# Patient Record
Sex: Female | Born: 1950
Health system: Southern US, Community
[De-identification: ages and names within clinical notes are randomized; demographics above are authoritative.]

## PROBLEM LIST (undated history)

## (undated) DIAGNOSIS — N390 Urinary tract infection, site not specified: Secondary | ICD-10-CM

## (undated) DIAGNOSIS — R112 Nausea with vomiting, unspecified: Secondary | ICD-10-CM

## (undated) DIAGNOSIS — K219 Gastro-esophageal reflux disease without esophagitis: Secondary | ICD-10-CM

## (undated) DIAGNOSIS — M4800 Spinal stenosis, site unspecified: Secondary | ICD-10-CM

## (undated) DIAGNOSIS — Z9889 Other specified postprocedural states: Secondary | ICD-10-CM

## (undated) DIAGNOSIS — N840 Polyp of corpus uteri: Secondary | ICD-10-CM

## (undated) DIAGNOSIS — N952 Postmenopausal atrophic vaginitis: Secondary | ICD-10-CM

## (undated) DIAGNOSIS — Z8719 Personal history of other diseases of the digestive system: Secondary | ICD-10-CM

## (undated) DIAGNOSIS — M797 Fibromyalgia: Secondary | ICD-10-CM

## (undated) HISTORY — PX: FACIAL COSMETIC SURGERY: SHX629

## (undated) HISTORY — DX: Spinal stenosis, site unspecified: M48.00

## (undated) HISTORY — PX: LUMBAR FUSION: SHX111

## (undated) HISTORY — PX: DILATION AND CURETTAGE OF UTERUS: SHX78

## (undated) HISTORY — DX: Polyp of corpus uteri: N84.0

## (undated) HISTORY — PX: OTHER SURGICAL HISTORY: SHX169

## (undated) HISTORY — PX: KNEE SURGERY: SHX244

## (undated) HISTORY — DX: Postmenopausal atrophic vaginitis: N95.2

## (undated) HISTORY — DX: Urinary tract infection, site not specified: N39.0

## (undated) HISTORY — PX: BARTHOLIN CYST MARSUPIALIZATION: SHX5383

## (undated) HISTORY — PX: BUNIONECTOMY: SHX129

## (undated) HISTORY — DX: Fibromyalgia: M79.7

## (undated) HISTORY — PX: TUBAL LIGATION: SHX77

---

## 2002-04-22 ENCOUNTER — Other Ambulatory Visit: Admission: RE | Admit: 2002-04-22 | Discharge: 2002-04-22 | Payer: Self-pay | Admitting: Obstetrics and Gynecology

## 2003-04-27 ENCOUNTER — Other Ambulatory Visit: Admission: RE | Admit: 2003-04-27 | Discharge: 2003-04-27 | Payer: Self-pay | Admitting: Obstetrics and Gynecology

## 2003-08-11 ENCOUNTER — Ambulatory Visit (HOSPITAL_BASED_OUTPATIENT_CLINIC_OR_DEPARTMENT_OTHER): Admission: RE | Admit: 2003-08-11 | Discharge: 2003-08-11 | Payer: Self-pay | Admitting: Obstetrics and Gynecology

## 2004-05-17 ENCOUNTER — Other Ambulatory Visit: Admission: RE | Admit: 2004-05-17 | Discharge: 2004-05-17 | Payer: Self-pay | Admitting: Obstetrics and Gynecology

## 2004-10-31 ENCOUNTER — Ambulatory Visit (HOSPITAL_BASED_OUTPATIENT_CLINIC_OR_DEPARTMENT_OTHER): Admission: RE | Admit: 2004-10-31 | Discharge: 2004-10-31 | Payer: Self-pay | Admitting: Obstetrics and Gynecology

## 2004-10-31 ENCOUNTER — Encounter (INDEPENDENT_AMBULATORY_CARE_PROVIDER_SITE_OTHER): Payer: Self-pay | Admitting: *Deleted

## 2005-06-14 ENCOUNTER — Other Ambulatory Visit: Admission: RE | Admit: 2005-06-14 | Discharge: 2005-06-14 | Payer: Self-pay | Admitting: Obstetrics and Gynecology

## 2005-07-12 ENCOUNTER — Encounter: Admission: RE | Admit: 2005-07-12 | Discharge: 2005-07-12 | Payer: Self-pay | Admitting: Family Medicine

## 2006-06-20 ENCOUNTER — Other Ambulatory Visit: Admission: RE | Admit: 2006-06-20 | Discharge: 2006-06-20 | Payer: Self-pay | Admitting: Obstetrics and Gynecology

## 2007-06-21 ENCOUNTER — Other Ambulatory Visit: Admission: RE | Admit: 2007-06-21 | Discharge: 2007-06-21 | Payer: Self-pay | Admitting: Obstetrics and Gynecology

## 2008-02-21 ENCOUNTER — Encounter: Admission: RE | Admit: 2008-02-21 | Discharge: 2008-02-21 | Payer: Self-pay | Admitting: Family Medicine

## 2008-06-14 ENCOUNTER — Encounter: Admission: RE | Admit: 2008-06-14 | Discharge: 2008-06-14 | Payer: Self-pay | Admitting: Sports Medicine

## 2008-06-23 ENCOUNTER — Encounter: Payer: Self-pay | Admitting: Obstetrics and Gynecology

## 2008-06-23 ENCOUNTER — Ambulatory Visit: Payer: Self-pay | Admitting: Obstetrics and Gynecology

## 2008-06-23 ENCOUNTER — Other Ambulatory Visit: Admission: RE | Admit: 2008-06-23 | Discharge: 2008-06-23 | Payer: Self-pay | Admitting: Obstetrics and Gynecology

## 2008-06-24 ENCOUNTER — Ambulatory Visit: Payer: Self-pay | Admitting: Obstetrics and Gynecology

## 2008-08-19 ENCOUNTER — Ambulatory Visit: Payer: Self-pay | Admitting: Obstetrics and Gynecology

## 2008-09-02 ENCOUNTER — Ambulatory Visit: Payer: Self-pay | Admitting: Obstetrics and Gynecology

## 2008-09-09 ENCOUNTER — Ambulatory Visit (HOSPITAL_COMMUNITY): Admission: RE | Admit: 2008-09-09 | Discharge: 2008-09-09 | Payer: Self-pay | Admitting: Obstetrics and Gynecology

## 2008-09-24 ENCOUNTER — Encounter: Admission: RE | Admit: 2008-09-24 | Discharge: 2008-09-24 | Payer: Self-pay | Admitting: Family Medicine

## 2009-02-26 ENCOUNTER — Ambulatory Visit: Payer: Self-pay | Admitting: Obstetrics and Gynecology

## 2009-06-24 ENCOUNTER — Other Ambulatory Visit: Admission: RE | Admit: 2009-06-24 | Discharge: 2009-06-24 | Payer: Self-pay | Admitting: Obstetrics and Gynecology

## 2009-06-24 ENCOUNTER — Ambulatory Visit: Payer: Self-pay | Admitting: Obstetrics and Gynecology

## 2009-09-16 ENCOUNTER — Ambulatory Visit: Payer: Self-pay | Admitting: Obstetrics and Gynecology

## 2009-09-22 ENCOUNTER — Ambulatory Visit (HOSPITAL_COMMUNITY): Admission: RE | Admit: 2009-09-22 | Discharge: 2009-09-22 | Payer: Self-pay | Admitting: Obstetrics and Gynecology

## 2010-01-03 ENCOUNTER — Encounter
Admission: RE | Admit: 2010-01-03 | Discharge: 2010-01-03 | Payer: Self-pay | Admitting: Physical Medicine and Rehabilitation

## 2010-06-24 NOTE — Op Note (Signed)
[name], [name]                            ACCOUNT NO.:  [id number]   MEDICAL RECORD NO.:  [id number]                   PATIENT TYPE:  AMB   LOCATION:  [facility]                                 FACILITY:  [facility]   PHYSICIAN:  Daniel L. Eda Paschal, M.D.           DATE OF BIRTH:  Sep 26, 1950   DATE OF PROCEDURE:  08/11/2003  DATE OF DISCHARGE:                                 OPERATIVE REPORT   PREOPERATIVE DIAGNOSIS:  Bartholin cyst.   POSTOPERATIVE DIAGNOSIS:  Bartholin cyst.   OPERATION:  Marsupialization of left Bartholin cyst.   SURGEON:  Dr. Eda Paschal   ANESTHESIA:  MAC plus 1% plain local infiltrative anesthesia.   INDICATIONS:  The patient is a 60 year old female, who came to my office  last week with a small Bartholin cyst.  She has been using sitz baths, and  it has continued to increase in size.  She now enters the hospital for  marsupialization of the above.   FINDINGS:  At the time of the procedure, the patient's only abnormal finding  was a left Bartholin's enlargement.  It drained semi-purulent material.  The  cyst wall itself was somewhat ragged and not very shiny, and so it was not a  perfect cyst wall to marsupialize.   DESCRIPTION OF PROCEDURE:  The patient was taken to the operating room,  placed in the dorsal lithotomy position, prepped and draped in usual sterile  manner.  She was given MAC anesthesia by the anesthesist and 1% plain  Xylocaine by Dr. Eda Paschal.  An incision was made at the mucocutaneous  junction, and semi-purulent material was drained.  The cyst wall was  identified 360 degrees around, although it was somewhat ragged and not a  cyst wall that was easy to marsupialize.  It was, however, then  marsupialized with 3-0 Vicryl in a running locking suture.  A hemostat was  used to check for other loculations, and there were none.  It was a single  pocket.  The surgeon also put his finger in rectally to be sure he had not  gotten close to the rectum  and the above dissection.  Copious irrigation was  done with sterile saline at the termination of the procedure, and there was  absolutely no bleeding noted.  The patient tolerated the procedure well and  left the operating room in satisfactory condition.                                               Daniel L. Eda Paschal, M.D.    [name]  D:  08/11/2003  T:  08/11/2003  Job:  [id number]

## 2010-06-24 NOTE — Op Note (Signed)
Deanna [name], FITZWATER                ACCOUNT NO.:  [id number]   MEDICAL RECORD NO.:  [id number]          PATIENT TYPE:  AMB   LOCATION:  [facility]                         FACILITY:  [facility]   PHYSICIAN:  Daniel L. Gottsegen, M.D.DATE OF BIRTH:  1951-01-25   DATE OF PROCEDURE:  10/31/2004  DATE OF DISCHARGE:                                 OPERATIVE REPORT   PREOPERATIVE DIAGNOSES:  1.  Postmenopausal bleeding.  2.  Endometrial polyp.   POSTOPERATIVE DIAGNOSES:  1.  Postmenopausal bleeding.  2.  Endometrial polyp.   OPERATION:  Hysteroscopy with excision of endometrial polyp.   SURGEON:  Dr. Eda Paschal.   ANESTHESIA:  General.   INDICATIONS:  The patient is a 60 year old gravida 2, para 2, AB 0, who is  postmenopausal and had an episode of inappropriate bleeding, not on hormonal  replacement therapy.  Ultrasound was done which showed an enlarged  endometrial stripe of 13.5 mm, and the intrauterine cavity was consistent  with endometrial polyp.  She enters the hospital now for hysteroscopy,  excision of endometrial polyp, and systematic evaluation of the intrauterine  cavity.   FINDINGS:  External and vaginal is normal.  Cervix is clean.  Uterus is  retroverted, normal size and shape with first-degree uterine descensus.  Adnexa fails to reveal masses.  At the time hysteroscopy, the patient had an  endometrial polyp coming off the top of the fundus.  It was about 1-1.5 cm  in size.  Other than this, there was no other intrauterine pathology.  There  was just a suggestion of a submucous myoma on the anterior wall of the  fundus, but it was not intracavitary   PROCEDURE:  After adequate general anesthesia, the patient was placed in the  dorsal lithotomy position, prepped and draped in the usual sterile manner.  A single-tooth tenaculum was placed on the anterior lip of the cervix.  The  cervix was dilated to #31 [facility] dilator.  A hysteroscopic resectoscope was  introduced; 3% sorbitol  was used to expand the intrauterine cavity.  A  camera was used for magnification.  A 90-degree wire loop with setting  __________ unit of 70 coag, 80 cutting pure cut were set.  The polyp was  identified.  It was completely excised.  There was no bleeding noted.  No  attempt was made to cut into the anterior wall to identify whether this was  a small  myoma as it was doubtful this was causing her bleeding and also clearly was  in the myometrium not in the cavity.  At the termination of procedure, there  was no bleeding noted.  Fluid deficit was 100 mL.  The patient tolerated the  procedure well and left the operating room in satisfactory condition.      Daniel L. Eda Paschal, M.D.  Electronically Signed     DLG/MEDQ  D:  10/31/2004  T:  10/31/2004  Job:  [id number]

## 2010-07-07 ENCOUNTER — Other Ambulatory Visit: Payer: Self-pay | Admitting: Obstetrics and Gynecology

## 2010-07-07 ENCOUNTER — Encounter (INDEPENDENT_AMBULATORY_CARE_PROVIDER_SITE_OTHER): Payer: BC Managed Care – PPO | Admitting: Obstetrics and Gynecology

## 2010-07-07 ENCOUNTER — Other Ambulatory Visit (HOSPITAL_COMMUNITY)
Admission: RE | Admit: 2010-07-07 | Discharge: 2010-07-07 | Disposition: A | Discharge status: Home / Self Care | Payer: BC Managed Care – PPO | Source: Ambulatory Visit | Attending: Obstetrics and Gynecology | Admitting: Obstetrics and Gynecology

## 2010-07-07 DIAGNOSIS — Z124 Encounter for screening for malignant neoplasm of cervix: Secondary | ICD-10-CM | POA: Insufficient documentation

## 2010-07-07 DIAGNOSIS — Z01419 Encounter for gynecological examination (general) (routine) without abnormal findings: Secondary | ICD-10-CM

## 2010-07-07 DIAGNOSIS — R82998 Other abnormal findings in urine: Secondary | ICD-10-CM

## 2010-07-07 DIAGNOSIS — Z13220 Encounter for screening for lipoid disorders: Secondary | ICD-10-CM

## 2010-08-23 ENCOUNTER — Other Ambulatory Visit: Payer: Self-pay | Admitting: Dermatology

## 2010-09-06 ENCOUNTER — Telehealth: Payer: Self-pay | Admitting: *Deleted

## 2010-09-06 DIAGNOSIS — M810 Age-related osteoporosis without current pathological fracture: Secondary | ICD-10-CM

## 2010-09-12 NOTE — Telephone Encounter (Signed)
Patient called back from [facility] and told her benefits for Reclast. (Approx billed $300) Patient said ok to proceed.  We will set up appt for labs, and then set up infusion at [facility].

## 2010-09-20 ENCOUNTER — Other Ambulatory Visit: Payer: BC Managed Care – PPO | Admitting: *Deleted

## 2010-09-20 DIAGNOSIS — M810 Age-related osteoporosis without current pathological fracture: Secondary | ICD-10-CM

## 2010-09-20 LAB — CREATININE, SERUM: Creat: 0.91 mg/dL (ref 0.50–1.10)

## 2010-09-23 ENCOUNTER — Telehealth: Payer: Self-pay | Admitting: *Deleted

## 2010-09-23 NOTE — Telephone Encounter (Signed)
Patient called about Reclast  Appointment.  Patient is set for Reclast at [facility] for 09/30/10 @1100 .  Order is faxed, and patient approx cost will be $300.  Instruction letter mailed.

## 2010-09-28 ENCOUNTER — Encounter: Payer: Self-pay | Admitting: Obstetrics and Gynecology

## 2010-09-30 ENCOUNTER — Ambulatory Visit (HOSPITAL_COMMUNITY): Payer: BC Managed Care – PPO | Attending: Obstetrics and Gynecology

## 2010-09-30 DIAGNOSIS — M810 Age-related osteoporosis without current pathological fracture: Secondary | ICD-10-CM | POA: Insufficient documentation

## 2010-10-21 ENCOUNTER — Ambulatory Visit: Payer: BC Managed Care – PPO

## 2010-11-29 ENCOUNTER — Other Ambulatory Visit: Payer: Self-pay | Admitting: Dermatology

## 2011-06-16 ENCOUNTER — Encounter: Payer: Self-pay | Admitting: Obstetrics and Gynecology

## 2011-06-30 ENCOUNTER — Encounter: Payer: Self-pay | Admitting: Gynecology

## 2011-06-30 DIAGNOSIS — M4800 Spinal stenosis, site unspecified: Secondary | ICD-10-CM | POA: Insufficient documentation

## 2011-06-30 DIAGNOSIS — N840 Polyp of corpus uteri: Secondary | ICD-10-CM | POA: Insufficient documentation

## 2011-06-30 DIAGNOSIS — N952 Postmenopausal atrophic vaginitis: Secondary | ICD-10-CM | POA: Insufficient documentation

## 2011-06-30 DIAGNOSIS — M810 Age-related osteoporosis without current pathological fracture: Secondary | ICD-10-CM | POA: Insufficient documentation

## 2011-06-30 DIAGNOSIS — M797 Fibromyalgia: Secondary | ICD-10-CM | POA: Insufficient documentation

## 2011-07-11 ENCOUNTER — Encounter: Payer: BC Managed Care – PPO | Admitting: Obstetrics and Gynecology

## 2011-07-18 ENCOUNTER — Encounter: Payer: Self-pay | Admitting: Obstetrics and Gynecology

## 2011-07-18 ENCOUNTER — Ambulatory Visit (INDEPENDENT_AMBULATORY_CARE_PROVIDER_SITE_OTHER): Payer: BC Managed Care – PPO | Admitting: Obstetrics and Gynecology

## 2011-07-18 VITALS — BP 124/76 | Ht 62.0 in | Wt 110.0 lb

## 2011-07-18 DIAGNOSIS — Z01419 Encounter for gynecological examination (general) (routine) without abnormal findings: Secondary | ICD-10-CM

## 2011-07-18 MED ORDER — ESTRADIOL 10 MCG VA TABS: 10.0000 ug | ORAL_TABLET | VAGINAL | Status: DC

## 2011-07-18 NOTE — Progress Notes (Signed)
Patient came to see me today for her annual GYN exam. She uses Vagifem once a week. It is helped with her atrophic vaginitis. If she uses it  twice a week she gets too much vaginal discharge. She is having no pelvic pain. She is having no vaginal bleeding. Last year her bone density continued to show some loss of bone. She has now had 3 years of Reclast. She has had no fractures. She previously had a complete workup for secondary causes of bone loss. She is having no bladder symptoms. She has never had an abnormal Pap smear.  Physical examination: Kennon Portela present. HEENT within normal limits. Neck: Thyroid not large. No masses. Supraclavicular nodes: not enlarged. Breasts: Examined in both sitting and lying  position. No skin changes and no masses. Abdomen: Soft no guarding rebound or masses or hernia. Pelvic: External: Within normal limits. BUS: Within normal limits. Vaginal:within normal limits. Good estrogen effect. No evidence of cystocele rectocele or enterocele. Cervix: clean. Uterus: Normal size and shape. Adnexa: No masses. Rectovaginal exam: Confirmatory and negative. Extremities: Within normal limits.  Assessment: #1. Atrophic vaginitis #2. Osteoporosis  Plan: Continue yearly mammograms. Discussed her bone density. I still think Reclast offers her good protection. We will do a follow up bone density next year. We have preliminary discussion about Forteo. She will continue Vagifem once weekly.

## 2011-07-19 LAB — URINALYSIS W MICROSCOPIC + REFLEX CULTURE
Casts: NONE SEEN
Crystals: NONE SEEN
Glucose, UA: NEGATIVE mg/dL
Ketones, ur: NEGATIVE mg/dL
Leukocytes, UA: NEGATIVE
Nitrite: NEGATIVE
Specific Gravity, Urine: 1.016 (ref 1.005–1.030)
pH: 6 (ref 5.0–8.0)

## 2011-08-09 ENCOUNTER — Other Ambulatory Visit: Payer: Self-pay | Admitting: Sports Medicine

## 2011-08-09 DIAGNOSIS — M542 Cervicalgia: Secondary | ICD-10-CM

## 2011-09-02 ENCOUNTER — Ambulatory Visit
Admission: RE | Admit: 2011-09-02 | Discharge: 2011-09-02 | Disposition: A | Discharge status: Home / Self Care | Payer: BC Managed Care – PPO | Source: Ambulatory Visit | Attending: Sports Medicine | Admitting: Sports Medicine

## 2011-09-02 DIAGNOSIS — M542 Cervicalgia: Secondary | ICD-10-CM

## 2011-09-19 ENCOUNTER — Telehealth: Payer: Self-pay | Admitting: *Deleted

## 2011-09-19 NOTE — Telephone Encounter (Signed)
Patient informed Reclast benefits.  Cost approx $300.  Set up patient lab appt for 09/29/11 and we will set up Reclast when the results have come back.

## 2011-09-29 ENCOUNTER — Other Ambulatory Visit: Payer: Self-pay | Admitting: *Deleted

## 2011-09-29 ENCOUNTER — Other Ambulatory Visit: Payer: BC Managed Care – PPO

## 2011-09-29 DIAGNOSIS — M810 Age-related osteoporosis without current pathological fracture: Secondary | ICD-10-CM

## 2011-10-06 ENCOUNTER — Telehealth: Payer: Self-pay | Admitting: *Deleted

## 2011-10-06 NOTE — Telephone Encounter (Signed)
Pt is ready to schedule Reclast. Pt had labs, benefits $100 deductible and 10%. Apt 10/16/11 at 10am [facility]. Order to be faxed. KW

## 2011-10-13 ENCOUNTER — Other Ambulatory Visit (HOSPITAL_COMMUNITY): Payer: Self-pay | Admitting: *Deleted

## 2011-10-16 ENCOUNTER — Encounter (HOSPITAL_COMMUNITY)
Admission: RE | Admit: 2011-10-16 | Discharge: 2011-10-16 | Disposition: A | Discharge status: Home / Self Care | Payer: BC Managed Care – PPO | Source: Ambulatory Visit | Attending: Obstetrics and Gynecology | Admitting: Obstetrics and Gynecology

## 2011-10-16 DIAGNOSIS — M810 Age-related osteoporosis without current pathological fracture: Secondary | ICD-10-CM | POA: Insufficient documentation

## 2011-10-16 MED ORDER — ZOLEDRONIC ACID 5 MG/100ML IV SOLN
INTRAVENOUS | Status: AC
  Administered 2011-10-16: 5 mg via INTRAVENOUS
  Filled 2011-10-16: qty 100

## 2011-10-16 MED ORDER — ZOLEDRONIC ACID 5 MG/100ML IV SOLN
5.0000 mg | Freq: Once | INTRAVENOUS | Status: AC
  Administered 2011-10-16: 5 mg via INTRAVENOUS

## 2012-06-06 ENCOUNTER — Other Ambulatory Visit: Payer: Self-pay | Admitting: Neurosurgery

## 2012-07-24 ENCOUNTER — Ambulatory Visit (INDEPENDENT_AMBULATORY_CARE_PROVIDER_SITE_OTHER): Payer: BC Managed Care – PPO | Admitting: Gynecology

## 2012-07-24 ENCOUNTER — Encounter: Payer: Self-pay | Admitting: Gynecology

## 2012-07-24 VITALS — BP 104/76 | HR 72 | Resp 16

## 2012-07-24 DIAGNOSIS — Z124 Encounter for screening for malignant neoplasm of cervix: Secondary | ICD-10-CM

## 2012-07-24 DIAGNOSIS — M810 Age-related osteoporosis without current pathological fracture: Secondary | ICD-10-CM

## 2012-07-24 DIAGNOSIS — Z01419 Encounter for gynecological examination (general) (routine) without abnormal findings: Secondary | ICD-10-CM

## 2012-07-24 DIAGNOSIS — Z87440 Personal history of urinary (tract) infections: Secondary | ICD-10-CM

## 2012-07-24 LAB — POCT URINALYSIS DIPSTICK
Blood, UA: NEGATIVE
Protein, UA: NEGATIVE

## 2012-07-24 NOTE — Progress Notes (Signed)
62 y.o.   Married    Caucasian   female   G2P2002   here for annual exam. Pt is currently doing reclast for osteoporosis is due for repeat DEXA in July,she is also having spinal fusion in July L4/L5.  Pt is using vagifem and is not in need for extra lubrication.  Pt denies vaginal bleeding. Recent UTI treated with amoxicillin-denies symptoms of yeast  No LMP recorded. Patient is postmenopausal.          Sexually active: yes  The current method of family planning is post menopausal status.    Exercising: Yoga at home daily with stretching  Last mammogram:  5/14 normal Last pap smear: 5/12  History of abnormal pap: No Smoking:No Alcohol:Yes 1-2 per week Last colonoscopy:10 years ago due this year Last Bone Density:  Scheduled for early July Last tetanus shot:02/2012 Last cholesterol check: 06/2011 elevated total 256 per patient  Hgb:                Urine: Had UTI recently was seen by PCP but no TOC, still shows leuks   Family History  Problem Relation Age of Onset  . Heart disease Mother   . Dementia Mother   . Hypertension Father   . Heart disease Father   . Other Father     Myothenia Gravis  . Melanoma Father   . Dementia Father     Patient Active Problem List   Diagnosis Date Noted  . Endometrial polyp   . Osteoporosis   . Atrophic vaginitis   . Spinal stenosis   . Fibromyalgia     Past Medical History  Diagnosis Date  . Endometrial polyp   . Osteoporosis   . Atrophic vaginitis   . Spinal stenosis   . Fibromyalgia   . Chronic urinary tract infection     Past Surgical History  Procedure Laterality Date  . Bunionectomy    . Tubal ligation    . Bartholin cyst marsupialization    . Dilation and curettage of uterus    . Facial cosmetic surgery    . Ear lobe surg      Allergies: Review of patient's allergies indicates no known allergies.  Current Outpatient Prescriptions  Medication Sig Dispense Refill  . Acetaminophen (TYLENOL 8 HOUR PO) Take by mouth.       Marland Kitchen b complex vitamins tablet Take 1 tablet by mouth daily.      . Calcium Carbonate-Vitamin D (CALCIUM + D PO) Take by mouth.      . Carisoprodol (SOMA PO) Take by mouth.      . Celecoxib (CELEBREX PO) Take by mouth.      . Cholecalciferol (VITAMIN D PO) Take by mouth.      . Estradiol (VAGIFEM) 10 MCG TABS Place 1 tablet (10 mcg total) vaginally 2 (two) times a week.  24 tablet  4  . zoledronic acid (RECLAST) 5 MG/100ML SOLN Inject 5 mg into the vein once.       No current facility-administered medications for this visit.    ROS: Pertinent items are noted in HPI.  Social Hx:    Exam:    BP 104/76  Pulse 72  Resp 16   Wt Readings from Last 3 Encounters:  07/18/11 110 lb (49.896 kg)     Ht Readings from Last 3 Encounters:  07/18/11 5\' 2"  (1.575 m)    General appearance: alert, cooperative and appears stated age Head: Normocephalic, without obvious abnormality, atraumatic Neck: no adenopathy,  supple, symmetrical, trachea midline and thyroid not enlarged, symmetric, no tenderness/mass/nodules Lungs: clear to auscultation bilaterally Breasts: Inspection negative, No nipple retraction or dimpling, No nipple discharge or bleeding, No axillary or supraclavicular adenopathy, Normal to palpation without dominant masses Heart: regular rate and rhythm Abdomen: soft, non-tender; bowel sounds normal; no masses,  no organomegaly Extremities: extremities normal, atraumatic, no cyanosis or edema Skin: Skin color, texture, turgor normal. No rashes or lesions Lymph nodes: Cervical, supraclavicular, and axillary nodes normal. No abnormal inguinal nodes palpated Neurologic: Grossly normal   Pelvic: External genitalia:  no lesions              Urethra:  normal appearing urethra with no masses, tenderness or lesions              Bartholins and Skenes: normal                 Vagina: pale and smooth              Cervix: normal appearance              Pap taken: yes        Bimanual Exam:   Uterus:  uterus is normal size, shape, consistency and nontender                                      Adnexa: normal adnexa in size, nontender and no masses                                      Rectovaginal: Confirms                                      Anus:  normal sphincter tone, no lesions  A: normal menopausal exam Atrophic vaginitis osteoporosis     P: mammogram pap smear with HR HPV, new guidelines reviewed Recommend DEXA before spinal surgery as f/u of reclast treatment Will call when vagifem rx runs out for refill return annually or prn     An After Visit Summary was printed and given to the patient.

## 2012-07-24 NOTE — Patient Instructions (Signed)

## 2012-08-12 ENCOUNTER — Encounter (HOSPITAL_COMMUNITY)
Admission: RE | Admit: 2012-08-12 | Discharge: 2012-08-12 | Disposition: A | Discharge status: Home / Self Care | Payer: BC Managed Care – PPO | Source: Ambulatory Visit | Attending: Neurosurgery | Admitting: Neurosurgery

## 2012-08-12 ENCOUNTER — Encounter (HOSPITAL_COMMUNITY): Payer: Self-pay

## 2012-08-12 DIAGNOSIS — Z01812 Encounter for preprocedural laboratory examination: Secondary | ICD-10-CM | POA: Insufficient documentation

## 2012-08-12 HISTORY — DX: Other specified postprocedural states: Z98.890

## 2012-08-12 HISTORY — DX: Nausea with vomiting, unspecified: R11.2

## 2012-08-12 HISTORY — DX: Gastro-esophageal reflux disease without esophagitis: K21.9

## 2012-08-12 HISTORY — DX: Personal history of other diseases of the digestive system: Z87.19

## 2012-08-12 LAB — SURGICAL PCR SCREEN
MRSA, PCR: NEGATIVE
Staphylococcus aureus: NEGATIVE

## 2012-08-12 LAB — CBC
HCT: 37.7 % (ref 36.0–46.0)
MCH: 29.7 pg (ref 26.0–34.0)
MCV: 88.9 fL (ref 78.0–100.0)
RBC: 4.24 MIL/uL (ref 3.87–5.11)
RDW: 13.1 % (ref 11.5–15.5)
WBC: 6.9 10*3/uL (ref 4.0–10.5)

## 2012-08-12 LAB — BASIC METABOLIC PANEL
BUN: 14 mg/dL (ref 6–23)
CO2: 31 mEq/L (ref 19–32)
Calcium: 10.2 mg/dL (ref 8.4–10.5)
Chloride: 102 mEq/L (ref 96–112)
Creatinine, Ser: 0.71 mg/dL (ref 0.50–1.10)

## 2012-08-12 NOTE — Pre-Procedure Instructions (Signed)
RUBLE BUTTLER  08/12/2012   Your procedure is scheduled on:  TUES  08/20/12   Report to [facility] at 530  AM.  Call this number if you have problems the morning of surgery: [phone]   Remember:   Do not eat food or drink liquids after midnight.   Take these medicines the morning of surgery with A SIP OF WATER:  TYLENOL IF NEEDED (STOP ASPIRIN, BLOOD THINNERS, HERBAL MEDICINES)   Do not wear jewelry, make-up or nail polish.  Do not wear lotions, powders, or perfumes. You may wear deodorant.  Do not shave 48 hours prior to surgery. Men may shave face and neck.  Do not bring valuables to the hospital.  [facility] is not responsible                   for any belongings or valuables.  Contacts, dentures or bridgework may not be worn into surgery.  Leave suitcase in the car. After surgery it may be brought to your room.  For patients admitted to the hospital, checkout time is 11:00 AM the day of  discharge.   Patients discharged the day of surgery will not be allowed to drive  home.  Name and phone number of your driver:  Special Instructions: Shower using CHG 2 nights before surgery and the night before surgery.  If you shower the day of surgery use CHG.  Use special wash - you have one bottle of CHG for all showers.  You should use approximately 1/3 of the bottle for each shower.   Please read over the following fact sheets that you were given: Pain Booklet, Coughing and Deep Breathing, Blood Transfusion Information and Surgical Site Infection Prevention

## 2012-08-14 NOTE — Progress Notes (Signed)
Left message with Shanda Bumps requesting orders.

## 2012-08-19 NOTE — H&P (Signed)
Deanna [name] is an 62 y.o. female.   Chief Complaint: lumbar pain HPI: patient with a history of lumbar pain for more than 4 years with radiation to the left leg which is not better with conservative treatment. Also neck pain  with radiation to both arms. Patient had a work up which showed spondylolisthesis grade 2 at the lumba4-5. Previous cervical mri showed stenosis at cervical 45 and 56.   Past Medical History  Diagnosis Date  . Endometrial polyp   . Osteoporosis   . Atrophic vaginitis   . Spinal stenosis   . Fibromyalgia   . Chronic urinary tract infection   . PONV (postoperative nausea and vomiting)   . GERD (gastroesophageal reflux disease)   . H/O hiatal hernia     Past Surgical History  Procedure Laterality Date  . Bunionectomy    . Tubal ligation    . Bartholin cyst marsupialization    . Dilation and curettage of uterus    . Facial cosmetic surgery    . Ear lobe surg    . Knee surgery      40 YEARS AGO ( LEFT)    Family History  Problem Relation Age of Onset  . Heart disease Mother   . Dementia Mother   . Hypertension Father   . Heart disease Father   . Other Father     Myothenia Gravis  . Melanoma Father   . Dementia Father    Social History:  reports that she has never smoked. She does not have any smokeless tobacco history on file. She reports that she drinks about 1.0 ounces of alcohol per week. She reports that she does not use illicit drugs.  Allergies: No Known Allergies  No prescriptions prior to admission    No results found for this or any previous visit (from the past 48 hour(s)). No results found.  Review of Systems  Constitutional: Negative.   HENT: Positive for congestion.   Eyes: Negative.   Respiratory: Negative.   Gastrointestinal: Negative.   Musculoskeletal: Positive for back pain.  Skin: Negative.   Neurological: Positive for focal weakness.  Endo/Heme/Allergies: Negative.   Psychiatric/Behavioral: Negative.     There were  no vitals taken for this visit. Physical Exam hent, nl. Neck, some pain with lateralization. Lungs, clear. Cv, nl. Abdomen, soft. Extremities, nl. NEURO SLR positive at 60 degrees. 4/5of DF feet. Mri shows severe stenosis at l4-5 with grade 2 spondylolisthesis.facet arthropathy  Asse patient to proceed with l4 laminectomies, facetectomies, discectomy  and fusion with pedoiles  screws and cages as  Well as posterolateral fusion. She and her husband are aware of risks and benefits. They had a seond opinion.  Mihail Prettyman M 08/19/2012, 7:17 PM

## 2012-08-19 NOTE — Progress Notes (Signed)
Dr. Cassandria Santee office called on 08-12-2012,08-14-2012,08-16-2012 for orders to be signed. Orders remain unsigned.Deanna [name]

## 2012-08-20 ENCOUNTER — Inpatient Hospital Stay (HOSPITAL_COMMUNITY)
Admission: RE | Admit: 2012-08-20 | Discharge: 2012-08-21 | DRG: 756 | Disposition: A | Discharge status: Home / Self Care | Payer: BC Managed Care – PPO | Source: Ambulatory Visit | Attending: Neurosurgery | Admitting: Neurosurgery

## 2012-08-20 ENCOUNTER — Ambulatory Visit (HOSPITAL_COMMUNITY): Payer: BC Managed Care – PPO

## 2012-08-20 ENCOUNTER — Ambulatory Visit (HOSPITAL_COMMUNITY): Payer: BC Managed Care – PPO | Admitting: Critical Care Medicine

## 2012-08-20 ENCOUNTER — Encounter (HOSPITAL_COMMUNITY)
Admission: RE | Disposition: A | Discharge status: Home / Self Care | Payer: Self-pay | Source: Ambulatory Visit | Attending: Neurosurgery

## 2012-08-20 ENCOUNTER — Encounter (HOSPITAL_COMMUNITY): Payer: Self-pay | Admitting: *Deleted

## 2012-08-20 ENCOUNTER — Encounter (HOSPITAL_COMMUNITY): Payer: Self-pay | Admitting: Critical Care Medicine

## 2012-08-20 DIAGNOSIS — K219 Gastro-esophageal reflux disease without esophagitis: Secondary | ICD-10-CM | POA: Diagnosis present

## 2012-08-20 DIAGNOSIS — M810 Age-related osteoporosis without current pathological fracture: Secondary | ICD-10-CM | POA: Diagnosis present

## 2012-08-20 DIAGNOSIS — Q762 Congenital spondylolisthesis: Principal | ICD-10-CM

## 2012-08-20 DIAGNOSIS — IMO0001 Reserved for inherently not codable concepts without codable children: Secondary | ICD-10-CM | POA: Diagnosis present

## 2012-08-20 SURGERY — POSTERIOR LUMBAR FUSION 1 LEVEL
Anesthesia: General | Site: Back | Wound class: Clean

## 2012-08-20 MED ORDER — ONDANSETRON HCL 4 MG/2ML IJ SOLN: 4.0000 mg | INTRAMUSCULAR | Status: DC | PRN

## 2012-08-20 MED ORDER — MIDAZOLAM HCL 5 MG/5ML IJ SOLN
INTRAMUSCULAR | Status: DC | PRN
  Administered 2012-08-20: 1 mg via INTRAVENOUS

## 2012-08-20 MED ORDER — NEOSTIGMINE METHYLSULFATE 1 MG/ML IJ SOLN
INTRAMUSCULAR | Status: DC | PRN
  Administered 2012-08-20: 3 mg via INTRAVENOUS

## 2012-08-20 MED ORDER — DIPHENHYDRAMINE HCL 12.5 MG/5ML PO ELIX: 12.5000 mg | ORAL_SOLUTION | Freq: Four times a day (QID) | ORAL | Status: DC | PRN

## 2012-08-20 MED ORDER — PROPOFOL 10 MG/ML IV BOLUS
INTRAVENOUS | Status: DC | PRN
  Administered 2012-08-20: 150 mg via INTRAVENOUS

## 2012-08-20 MED ORDER — DEXAMETHASONE SODIUM PHOSPHATE 4 MG/ML IJ SOLN
INTRAMUSCULAR | Status: DC | PRN
  Administered 2012-08-20: 4 mg via INTRAVENOUS

## 2012-08-20 MED ORDER — HEMOSTATIC AGENTS (NO CHARGE) OPTIME
TOPICAL | Status: DC | PRN
  Administered 2012-08-20: 1 via TOPICAL

## 2012-08-20 MED ORDER — SODIUM CHLORIDE 0.9 % IV SOLN
INTRAVENOUS | Status: DC
  Administered 2012-08-20 – 2012-08-21 (×2): via INTRAVENOUS

## 2012-08-20 MED ORDER — MORPHINE SULFATE (PF) 1 MG/ML IV SOLN
INTRAVENOUS | Status: DC
  Administered 2012-08-20: 4.5 mg via INTRAVENOUS
  Administered 2012-08-20: 1.5 mg via INTRAVENOUS
  Administered 2012-08-20: 10:00:00 via INTRAVENOUS
  Administered 2012-08-21 (×3): 1.5 mg via INTRAVENOUS

## 2012-08-20 MED ORDER — CEFAZOLIN SODIUM-DEXTROSE 2-3 GM-% IV SOLR
INTRAVENOUS | Status: AC
  Administered 2012-08-20: 2 g via INTRAVENOUS
  Filled 2012-08-20: qty 50

## 2012-08-20 MED ORDER — SCOPOLAMINE 1 MG/3DAYS TD PT72
MEDICATED_PATCH | TRANSDERMAL | Status: AC
  Administered 2012-08-20: 1 via TRANSDERMAL
  Filled 2012-08-20: qty 1

## 2012-08-20 MED ORDER — FENTANYL CITRATE 0.05 MG/ML IJ SOLN
INTRAMUSCULAR | Status: DC | PRN
  Administered 2012-08-20 (×2): 50 ug via INTRAVENOUS
  Administered 2012-08-20: 100 ug via INTRAVENOUS

## 2012-08-20 MED ORDER — THROMBIN 20000 UNITS EX SOLR
CUTANEOUS | Status: DC | PRN
  Administered 2012-08-20: 08:00:00 via TOPICAL

## 2012-08-20 MED ORDER — CEFAZOLIN SODIUM 1-5 GM-% IV SOLN
1.0000 g | Freq: Three times a day (TID) | INTRAVENOUS | Status: AC
  Administered 2012-08-20 – 2012-08-21 (×2): 1 g via INTRAVENOUS
  Filled 2012-08-20 (×2): qty 50

## 2012-08-20 MED ORDER — ONDANSETRON HCL 4 MG/2ML IJ SOLN
4.0000 mg | Freq: Once | INTRAMUSCULAR | Status: AC | PRN
  Administered 2012-08-20: 4 mg via INTRAVENOUS

## 2012-08-20 MED ORDER — LACTATED RINGERS IV SOLN
INTRAVENOUS | Status: DC | PRN
  Administered 2012-08-20 (×3): via INTRAVENOUS

## 2012-08-20 MED ORDER — ONDANSETRON HCL 4 MG/2ML IJ SOLN
INTRAMUSCULAR | Status: AC
  Filled 2012-08-20: qty 2

## 2012-08-20 MED ORDER — NALOXONE HCL 0.4 MG/ML IJ SOLN: 0.4000 mg | INTRAMUSCULAR | Status: DC | PRN

## 2012-08-20 MED ORDER — PROMETHAZINE HCL 25 MG/ML IJ SOLN
INTRAMUSCULAR | Status: AC
  Filled 2012-08-20: qty 1

## 2012-08-20 MED ORDER — MENTHOL 3 MG MT LOZG: 1.0000 | LOZENGE | OROMUCOSAL | Status: DC | PRN

## 2012-08-20 MED ORDER — PHENOL 1.4 % MT LIQD
1.0000 | OROMUCOSAL | Status: DC | PRN
  Administered 2012-08-20: 1 via OROMUCOSAL
  Filled 2012-08-20: qty 177

## 2012-08-20 MED ORDER — ACETAMINOPHEN 650 MG RE SUPP: 650.0000 mg | RECTAL | Status: DC | PRN

## 2012-08-20 MED ORDER — 0.9 % SODIUM CHLORIDE (POUR BTL) OPTIME
TOPICAL | Status: DC | PRN
  Administered 2012-08-20: 1000 mL

## 2012-08-20 MED ORDER — ARTIFICIAL TEARS OP OINT
TOPICAL_OINTMENT | OPHTHALMIC | Status: DC | PRN
  Administered 2012-08-20: 1 via OPHTHALMIC

## 2012-08-20 MED ORDER — MORPHINE SULFATE (PF) 1 MG/ML IV SOLN
INTRAVENOUS | Status: AC
  Filled 2012-08-20: qty 25

## 2012-08-20 MED ORDER — ROCURONIUM BROMIDE 100 MG/10ML IV SOLN
INTRAVENOUS | Status: DC | PRN
  Administered 2012-08-20 (×3): 10 mg via INTRAVENOUS
  Administered 2012-08-20: 30 mg via INTRAVENOUS

## 2012-08-20 MED ORDER — OXYCODONE-ACETAMINOPHEN 5-325 MG PO TABS
1.0000 | ORAL_TABLET | ORAL | Status: DC | PRN
  Administered 2012-08-21: 1 via ORAL
  Filled 2012-08-20: qty 1

## 2012-08-20 MED ORDER — HYDROMORPHONE HCL PF 1 MG/ML IJ SOLN: 0.2500 mg | INTRAMUSCULAR | Status: DC | PRN

## 2012-08-20 MED ORDER — ONDANSETRON HCL 4 MG/2ML IJ SOLN: 4.0000 mg | Freq: Four times a day (QID) | INTRAMUSCULAR | Status: DC | PRN

## 2012-08-20 MED ORDER — LIDOCAINE HCL (CARDIAC) 20 MG/ML IV SOLN
INTRAVENOUS | Status: DC | PRN
  Administered 2012-08-20: 100 mg via INTRAVENOUS

## 2012-08-20 MED ORDER — LIDOCAINE HCL 4 % MT SOLN
OROMUCOSAL | Status: DC | PRN
  Administered 2012-08-20: 4 mL via TOPICAL

## 2012-08-20 MED ORDER — PROMETHAZINE HCL 25 MG/ML IJ SOLN
6.2500 mg | Freq: Once | INTRAMUSCULAR | Status: AC
  Administered 2012-08-20 (×2): 6.25 mg via INTRAVENOUS

## 2012-08-20 MED ORDER — GLYCOPYRROLATE 0.2 MG/ML IJ SOLN
INTRAMUSCULAR | Status: DC | PRN
  Administered 2012-08-20 (×2): 0.1 mg via INTRAVENOUS
  Administered 2012-08-20: 0.4 mg via INTRAVENOUS

## 2012-08-20 MED ORDER — ONDANSETRON HCL 4 MG/2ML IJ SOLN
INTRAMUSCULAR | Status: DC | PRN
  Administered 2012-08-20 (×2): 4 mg via INTRAVENOUS

## 2012-08-20 MED ORDER — SODIUM CHLORIDE 0.9 % IJ SOLN: 3.0000 mL | INTRAMUSCULAR | Status: DC | PRN

## 2012-08-20 MED ORDER — DIPHENHYDRAMINE HCL 50 MG/ML IJ SOLN: 12.5000 mg | Freq: Four times a day (QID) | INTRAMUSCULAR | Status: DC | PRN

## 2012-08-20 MED ORDER — BUPIVACAINE LIPOSOME 1.3 % IJ SUSP
INTRAMUSCULAR | Status: DC | PRN
  Administered 2012-08-20: 20 mL

## 2012-08-20 MED ORDER — SODIUM CHLORIDE 0.9 % IJ SOLN: 9.0000 mL | INTRAMUSCULAR | Status: DC | PRN

## 2012-08-20 MED ORDER — DIAZEPAM 5 MG PO TABS
5.0000 mg | ORAL_TABLET | Freq: Four times a day (QID) | ORAL | Status: DC | PRN
  Administered 2012-08-20 – 2012-08-21 (×2): 5 mg via ORAL
  Filled 2012-08-20 (×2): qty 1

## 2012-08-20 MED ORDER — ACETAMINOPHEN 325 MG PO TABS: 650.0000 mg | ORAL_TABLET | ORAL | Status: DC | PRN

## 2012-08-20 MED ORDER — BUPIVACAINE LIPOSOME 1.3 % IJ SUSP
20.0000 mL | INTRAMUSCULAR | Status: AC
  Filled 2012-08-20: qty 20

## 2012-08-20 MED ORDER — ZOLPIDEM TARTRATE 5 MG PO TABS: 5.0000 mg | ORAL_TABLET | Freq: Every evening | ORAL | Status: DC | PRN

## 2012-08-20 MED ORDER — EPHEDRINE SULFATE 50 MG/ML IJ SOLN
INTRAMUSCULAR | Status: DC | PRN
  Administered 2012-08-20 (×3): 5 mg via INTRAVENOUS

## 2012-08-20 MED ORDER — SODIUM CHLORIDE 0.9 % IJ SOLN: 3.0000 mL | Freq: Two times a day (BID) | INTRAMUSCULAR | Status: DC

## 2012-08-20 SURGICAL SUPPLY — 64 items
APL SKNCLS STERI-STRIP NONHPOA (GAUZE/BANDAGES/DRESSINGS) ×1
BENZOIN TINCTURE PRP APPL 2/3 (GAUZE/BANDAGES/DRESSINGS) ×2 IMPLANT
BLADE SURG ROTATE 9660 (MISCELLANEOUS) IMPLANT
BONE EQUIVA 10CC (Bone Implant) ×1 IMPLANT
BUR ACORN 6.0 (BURR) ×2 IMPLANT
BUR MATCHSTICK NEURO 3.0 LAGG (BURR) ×2 IMPLANT
CANISTER SUCTION 2500CC (MISCELLANEOUS) ×2 IMPLANT
CAP REVERE LOCKING (Cap) ×4 IMPLANT
CLOTH BEACON ORANGE TIMEOUT ST (SAFETY) ×2 IMPLANT
CONT SPEC 4OZ CLIKSEAL STRL BL (MISCELLANEOUS) ×3 IMPLANT
COVER BACK TABLE 24X17X13 BIG (DRAPES) IMPLANT
COVER TABLE BACK 60X90 (DRAPES) ×2 IMPLANT
DRAPE C-ARM 42X72 X-RAY (DRAPES) ×4 IMPLANT
DRAPE LAPAROTOMY 100X72X124 (DRAPES) ×2 IMPLANT
DRAPE POUCH INSTRU U-SHP 10X18 (DRAPES) ×2 IMPLANT
DRSG PAD ABDOMINAL 8X10 ST (GAUZE/BANDAGES/DRESSINGS) ×1 IMPLANT
DURAPREP 26ML APPLICATOR (WOUND CARE) ×2 IMPLANT
ELECT REM PT RETURN 9FT ADLT (ELECTROSURGICAL) ×2
ELECTRODE REM PT RTRN 9FT ADLT (ELECTROSURGICAL) ×1 IMPLANT
EVACUATOR 1/8 PVC DRAIN (DRAIN) IMPLANT
EVACUATOR 3/16  PVC DRAIN (DRAIN) ×1
EVACUATOR 3/16 PVC DRAIN (DRAIN) IMPLANT
GAUZE SPONGE 4X4 16PLY XRAY LF (GAUZE/BANDAGES/DRESSINGS) ×2 IMPLANT
GLOVE BIOGEL M 8.0 STRL (GLOVE) ×2 IMPLANT
GLOVE EXAM NITRILE LRG STRL (GLOVE) IMPLANT
GLOVE EXAM NITRILE MD LF STRL (GLOVE) IMPLANT
GLOVE EXAM NITRILE XL STR (GLOVE) IMPLANT
GLOVE EXAM NITRILE XS STR PU (GLOVE) IMPLANT
GOWN BRE IMP SLV AUR LG STRL (GOWN DISPOSABLE) ×2 IMPLANT
GOWN BRE IMP SLV AUR XL STRL (GOWN DISPOSABLE) IMPLANT
GOWN STRL REIN 2XL LVL4 (GOWN DISPOSABLE) IMPLANT
KIT BASIN OR (CUSTOM PROCEDURE TRAY) ×2 IMPLANT
KIT ROOM TURNOVER OR (KITS) ×2 IMPLANT
MILL MEDIUM DISP (BLADE) ×1 IMPLANT
NDL HYPO 18GX1.5 BLUNT FILL (NEEDLE) IMPLANT
NDL HYPO 21X1.5 SAFETY (NEEDLE) IMPLANT
NDL HYPO 25X1 1.5 SAFETY (NEEDLE) IMPLANT
NEEDLE HYPO 18GX1.5 BLUNT FILL (NEEDLE) IMPLANT
NEEDLE HYPO 21X1.5 SAFETY (NEEDLE) ×2 IMPLANT
NEEDLE HYPO 25X1 1.5 SAFETY (NEEDLE) IMPLANT
NS IRRIG 1000ML POUR BTL (IV SOLUTION) ×2 IMPLANT
PACK LAMINECTOMY NEURO (CUSTOM PROCEDURE TRAY) ×2 IMPLANT
PAD ARMBOARD 7.5X6 YLW CONV (MISCELLANEOUS) ×6 IMPLANT
PATTIES SURGICAL .5 X1 (DISPOSABLE) ×2 IMPLANT
PATTIES SURGICAL .5 X3 (DISPOSABLE) IMPLANT
SCREW REVERE 5.5X45 (Screw) ×2 IMPLANT
SCREW REVERE 6.35 5.5X40MM (Screw) ×2 IMPLANT
SPACER SUSTAIN O SML 8X22 12MM (Spacer) ×2 IMPLANT
SPONGE GAUZE 4X4 12PLY (GAUZE/BANDAGES/DRESSINGS) ×2 IMPLANT
SPONGE LAP 4X18 X RAY DECT (DISPOSABLE) IMPLANT
SPONGE NEURO XRAY DETECT 1X3 (DISPOSABLE) IMPLANT
SPONGE SURGIFOAM ABS GEL 100 (HEMOSTASIS) ×2 IMPLANT
STRIP CLOSURE SKIN 1/2X4 (GAUZE/BANDAGES/DRESSINGS) ×2 IMPLANT
SUT VIC AB 1 CT1 18XBRD ANBCTR (SUTURE) ×2 IMPLANT
SUT VIC AB 1 CT1 8-18 (SUTURE) ×4
SUT VIC AB 2-0 CP2 18 (SUTURE) ×2 IMPLANT
SUT VIC AB 3-0 SH 8-18 (SUTURE) ×2 IMPLANT
SYR 20CC LL (SYRINGE) ×1 IMPLANT
SYR 20ML ECCENTRIC (SYRINGE) ×2 IMPLANT
SYR 5ML LL (SYRINGE) IMPLANT
TOWEL OR 17X24 6PK STRL BLUE (TOWEL DISPOSABLE) ×2 IMPLANT
TOWEL OR 17X26 10 PK STRL BLUE (TOWEL DISPOSABLE) ×2 IMPLANT
TRAY FOLEY CATH 14FRSI W/METER (CATHETERS) ×2 IMPLANT
WATER STERILE IRR 1000ML POUR (IV SOLUTION) ×2 IMPLANT

## 2012-08-20 NOTE — Op Note (Signed)
NAMEJAZZMAN, LOUGHMILLER                ACCOUNT NO.:  [id number]  MEDICAL RECORD NO.:  [id number]  LOCATION:  [id number]                        FACILITY:  [facility]  PHYSICIAN:  Hilda Lias, M.D.   DATE OF BIRTH:  1950/04/26  DATE OF PROCEDURE:  08/20/2012 DATE OF DISCHARGE:                              OPERATIVE REPORT   PREOPERATIVE DIAGNOSIS:  Grade 1, grade 2 lumbar spondylolisthesis at L4- 5 with chronic radiculopathy and lumbar stenosis.  POSTOPERATIVE DIAGNOSIS:  Grade 1, grade 2 lumbar spondylolisthesis at L4-5 with chronic radiculopathy and lumbar stenosis.  PROCEDURE:  Gill procedure, which involves laminectomy, facetectomy.  L4- 5 diskectomy more than normally we do to be able to introduce two cages of 12 x 22, pedicle screws at L4-L5, posterolateral arthrodesis without bone extender and autograft.  Cell Saver, C-arm.  SURGEON:  Hilda Lias, M.D.  ASSISTANT:  Reinaldo Meeker, M.D.  CLINICAL HISTORY:  Ms. Bai is a 62 year old female who had been complaining of back pain radiation to both legs for at least 3 years. The pain is getting worse.  We had conservative treatment with no improvement.  MRI and plain x-rays showed grade 1 and grade 2 spondylolisthesis at the level of L4-5 with a stenosis.  Surgery was advised and she and her husband knew the risk with the surgery.  PROCEDURE:  The patient was taken to the OR, and after intubation, she was positioned in a prone manner.  The back was cleaned with Betadine first and later on with DuraPrep.  Drapes were applied.  Midline incision from L3-4 down to L5-S1 was made and muscle were retracted laterally all the way down to the lateral aspect of the facet and the transverse process.  We did two x-rays just to be sure that we were in the right area.  Indeed, the posterior arch of L4 was completely loose with quite a bit of hypertrophy and thickening of the ligament of the facet.  We proceed with the Gill, removing the spinous  process, both the lamina and the facet.  Then, the patient had quite a bit of adhesion and we had to do lysis of adhesion to be able to retract thecal sac as well as the L4 and L5 nerve roots.  Then, we entered the disk space first in the right side and then in the left side and total diskectomy where normally we do all the way laterally.  Good decompression was achieved. Then, the endplates were drilled.  We introduced two cages of 12 x 22, filled up with autograft and bone extender.  Lateral lumbar spine x-ray showed good position of the cages.  From then on, using the C-arm first in AP view and then a lateral view, we probed the pedicle of L4-L5.  At the level of L4, we were able to introduce two screws of 5.5 x 45, and at the level of L5, the screw for 4.5 x 40.  Then, we kept in place with a rod and Capps.  Then, we went laterally and we drilled the lateral aspect of the facet of L4-5 as well as the lamina of L5 and part of the L4.  Then, a mix of  autograft was used for arthrodesis.  The area was irrigated. Valsalva maneuver up to 40 was negative.  Drain was left in the epidural space and the wound was closed with Vicryl and Steri-Strips.  The patient did well.          ______________________________ Hilda Lias, M.D.     EB/MEDQ  D:  08/20/2012  T:  08/20/2012  Job:  [id number]

## 2012-08-20 NOTE — Preoperative (Signed)
Beta Blockers   Reason not to administer Beta Blockers:Not Applicable, pt not on beta blocker 

## 2012-08-20 NOTE — Progress Notes (Signed)
Op note [phone]

## 2012-08-20 NOTE — Progress Notes (Signed)
Orthopedic Tech Progress Note Patient Details:  Deanna [name] [birth year] [id number]  Patient ID: [name], female   DOB: [birth year], 62 y.o.   MRN: [id number] Brace order completed by Storm Frisk, Radley Teston 08/20/2012, 4:05 PM

## 2012-08-20 NOTE — Anesthesia Procedure Notes (Signed)
Procedure Name: Intubation Date/Time: 08/20/2012 7:47 AM Performed by: Elon Alas Pre-anesthesia Checklist: Patient identified, Timeout performed, Emergency Drugs available, Suction available and Patient being monitored Patient Re-evaluated:Patient Re-evaluated prior to inductionOxygen Delivery Method: Circle system utilized Preoxygenation: Pre-oxygenation with 100% oxygen Intubation Type: IV induction Ventilation: Mask ventilation without difficulty Laryngoscope Size: Mac and 3 Grade View: Grade II Tube size: 7.0 mm Number of attempts: 1 Airway Equipment and Method: Stylet and LTA kit utilized Placement Confirmation: positive ETCO2,  ETT inserted through vocal cords under direct vision and breath sounds checked- equal and bilateral Secured at: 21 cm Tube secured with: Tape Dental Injury: Teeth and Oropharynx as per pre-operative assessment

## 2012-08-20 NOTE — Progress Notes (Signed)
   CARE MANAGEMENT NOTE 08/20/2012  Patient:  Deanna [name], Deanna [name]   Account Number:  [id number]  Date Initiated:  08/20/2012  Documentation initiated by:  Jiles Crocker  Subjective/Objective Assessment:   ADMITTED FOR SURGERY - POSTERIOR LUMBAR FUSION 1 LEVEL     Action/Plan:   LIVES AT HOME WITH SPOUSE; CM FOLLOWING FOR DCP   Anticipated DC Date:  08/24/2012   Anticipated DC Plan:  HOME/SELF CARE      DC Planning Services  CM consult          Status of service:  In process, will continue to follow Medicare Important Message given?  NA - LOS <3 / Initial given by admissions (If response is "NO", the following Medicare IM given date fields will be blank)  Per UR Regulation:  Reviewed for med. necessity/level of care/duration of stay  Comments:  08/20/2012- B Terrill Alperin RN,BSN,MHA

## 2012-08-20 NOTE — Anesthesia Preprocedure Evaluation (Addendum)
Anesthesia Evaluation  Patient identified by MRN, date of birth, ID band Patient awake    Reviewed: Allergy & Precautions, H&P , NPO status , Patient's Chart, lab work & pertinent test results  History of Anesthesia Complications (+) PONV  Airway Mallampati: I TM Distance: >3 FB Neck ROM: Full    Dental  (+) Dental Advisory Given and Teeth Intact   Pulmonary          Cardiovascular     Neuro/Psych    GI/Hepatic hiatal hernia, GERD-  ,  Endo/Other    Renal/GU      Musculoskeletal  (+) Fibromyalgia -  Abdominal   Peds  Hematology   Anesthesia Other Findings   Reproductive/Obstetrics                          Anesthesia Physical Anesthesia Plan  ASA: II  Anesthesia Plan: General   Post-op Pain Management:    Induction: Intravenous  Airway Management Planned: Oral ETT  Additional Equipment:   Intra-op Plan:   Post-operative Plan: Extubation in OR  Informed Consent: I have reviewed the patients History and Physical, chart, labs and discussed the procedure including the risks, benefits and alternatives for the proposed anesthesia with the patient or authorized representative who has indicated his/her understanding and acceptance.   Dental advisory given  Plan Discussed with: Anesthesiologist, Surgeon and CRNA  Anesthesia Plan Comments:        Anesthesia Quick Evaluation

## 2012-08-20 NOTE — Anesthesia Postprocedure Evaluation (Signed)
  Anesthesia Post-op Note  Patient: Deanna [name]  Procedure(s) Performed: Procedure(s) with comments: POSTERIOR LUMBAR FUSION 1 LEVEL (N/A) - Lumbar four Gill procedure, Lumbar four-five Fusion/Cages/pedicle screws/Posterolateral arthrodesis  Patient Location: PACU  Anesthesia Type:General  Level of Consciousness: awake, oriented, sedated and patient cooperative  Airway and Oxygen Therapy: Patient Spontanous Breathing  Post-op Pain: mild  Post-op Assessment: Post-op Vital signs reviewed, Patient's Cardiovascular Status Stable, Respiratory Function Stable, Patent Airway, NAUSEA AND VOMITING PRESENT and Pain level controlled  Post-op Vital Signs: stable  Complications: No apparent anesthesia complications

## 2012-08-20 NOTE — Transfer of Care (Signed)
Immediate Anesthesia Transfer of Care Note  Patient: Deanna [name]  Procedure(s) Performed: Procedure(s) with comments: POSTERIOR LUMBAR FUSION 1 LEVEL (N/A) - Lumbar four Gill procedure, Lumbar four-five Fusion/Cages/pedicle screws/Posterolateral arthrodesis  Patient Location: PACU  Anesthesia Type:General  Level of Consciousness: awake and alert   Airway & Oxygen Therapy: Patient Spontanous Breathing and Patient connected to nasal cannula oxygen  Post-op Assessment: Report given to PACU RN, Post -op Vital signs reviewed and stable and Patient moving all extremities X 4  Post vital signs: Reviewed and stable  Complications: No apparent anesthesia complications

## 2012-08-20 NOTE — Progress Notes (Signed)
[employer] notified for brace fitting.

## 2012-08-21 MED FILL — Sodium Chloride IV Soln 0.9%: INTRAVENOUS | Qty: 1000 | Status: AC

## 2012-08-21 MED FILL — Heparin Sodium (Porcine) Inj 1000 Unit/ML: INTRAMUSCULAR | Qty: 30 | Status: AC

## 2012-08-21 NOTE — Discharge Summary (Signed)
Physician Discharge Summary  Patient ID: Deanna [name] MRN: [id number] DOB/AGE: [birth year] 62 y.o.  Admit date: 08/20/2012 Discharge date: 08/21/2012  Admission Diagnoses:lumbar spondylolisthesis  Discharge Diagnoses: same Active Problems:   * No active hospital problems. *   Discharged Condition: no pain  Hospital Course: surgery  Consults: none  Significant Diagnostic Studies:mri  Treatments: fusion  Discharge Exam: Blood pressure 93/56, pulse 77, temperature 97.6 F (36.4 C), temperature source Oral, resp. rate 16, height 5\' 2"  (1.575 m), weight 50.349 kg (111 lb), SpO2 100.00%. No apin  Disposition: home. PATIENT WANTS TO GO HOME TODAY     Medication List    ASK your doctor about these medications       acetaminophen 650 MG CR tablet  Commonly known as:  TYLENOL  Take 650 mg by mouth every 8 (eight) hours as needed for pain.     calcium carbonate 600 MG Tabs  Commonly known as:  OS-CAL  Take 600 mg by mouth daily with breakfast.     carisoprodol 350 MG tablet  Commonly known as:  SOMA  Take 350 mg by mouth at bedtime.     celecoxib 200 MG capsule  Commonly known as:  CELEBREX  Take 200 mg by mouth daily.     cholecalciferol 1000 UNITS tablet  Commonly known as:  VITAMIN D  Take 1,000 Units by mouth daily.     Estradiol 10 MCG Tabs vaginal tablet  Commonly known as:  VAGIFEM  Place 1 tablet (10 mcg total) vaginally 2 (two) times a week.     Magnesium 250 MG Tabs  Take 250 mg by mouth daily.     RECLAST 5 MG/100ML Soln  Generic drug:  zoledronic acid  Inject 5 mg into the vein once.         Signed: Karn Cassis 08/21/2012, 10:57 AM

## 2012-08-21 NOTE — Evaluation (Signed)
Physical Therapy Evaluation Patient Details Name: Deanna [name] MRN: [id number] DOB: [birth year] Today's Date: 08/21/2012 Time: 4098-1191 PT Time Calculation (min): 39 min  PT Assessment / Plan / Recommendation History of Present Illness  62 y/o female s/p L4 Gill procedure, L4-5 Fusion.  Clinical Impression  Moving well today following surgery. Limited by below impairments (see PT problem list). Will benefit skilled PT in the acute setting to increase their independence and safety with mobility (while adhering to precautions) to allow discharge home with support from husband.     PT Assessment  Patient needs continued PT services    Follow Up Recommendations  No PT follow up;Supervision - Intermittent    Does the patient have the potential to tolerate intense rehabilitation      Barriers to Discharge Decreased caregiver support husband will be gone during the day but can check on her frequently    Equipment Recommendations  None recommended by PT    Recommendations for Other Services     Frequency Min 5X/week    Precautions / Restrictions Precautions Precautions: Back Required Braces or Orthoses: Spinal Brace Spinal Brace: Lumbar corset;Applied in sitting position Restrictions Weight Bearing Restrictions: No   Pertinent Vitals/Pain Reports moderate pain that increases with transitional movements, PCA encouraged     Mobility  Bed Mobility Bed Mobility: Rolling Left;Left Sidelying to Sit Rolling Left: 5: Supervision Left Sidelying to Sit: 5: Supervision;HOB flat Details for Bed Mobility Assistance: verbal sequencing cues Transfers Transfers: Sit to Stand;Stand to Sit Sit to Stand: 4: Min guard;With upper extremity assist;From bed Stand to Sit: 5: Supervision;With upper extremity assist;To chair/3-in-1 Details for Transfer Assistance: cues for safe hand placement, gaurding for follow through as pt slow to rise due to pain Ambulation/Gait Ambulation/Gait Assistance:  5: Supervision Ambulation Distance (Feet): 220 Feet Assistive device: Rolling walker Ambulation/Gait Assistance Details: initial cues for safe technique with RW Gait Pattern: Step-through pattern;Decreased stride length Gait velocity: decreased, cautious speed Stairs: Yes Stairs Assistance: 5: Supervision Stairs Assistance Details (indicate cue type and reason): cues for sequencing Stair Management Technique: One rail Left Number of Stairs: 2    Exercises     PT Diagnosis: Acute pain  PT Problem List: Decreased activity tolerance;Decreased knowledge of use of DME;Decreased knowledge of precautions;Pain PT Treatment Interventions: DME instruction;Gait training;Patient/family education;Therapeutic activities;Functional mobility training     PT Goals(Current goals can be found in the care plan section) Acute Rehab PT Goals Patient Stated Goal: to go home today PT Goal Formulation: With patient Time For Goal Achievement: 08/28/12 Potential to Achieve Goals: Good  Visit Information  Last PT Received On: 08/21/12 Assistance Needed: +1 PT/OT Co-Evaluation/Treatment: Yes History of Present Illness: 62 y/o female s/p L4 Gill procedure, L4-5 Fusion.       Prior Functioning  Home Living Family/patient expects to be discharged to:: Private residence Living Arrangements: Spouse/significant other Available Help at Discharge: Family;Available PRN/intermittently Type of Home: House Home Access: Stairs to enter [employer] of Steps: 4 Entrance Stairs-Rails: Left Home Layout: One level Home Equipment: None Additional Comments: can borrow a RW from a friend if needed Prior Function Level of Independence: Independent Communication Communication: No difficulties    Cognition  Cognition Arousal/Alertness: Awake/alert Behavior During Therapy: WFL for tasks assessed/performed Overall Cognitive Status: Within Functional Limits for tasks assessed    Extremity/Trunk  Assessment Upper Extremity Assessment Upper Extremity Assessment: Defer to OT evaluation Lower Extremity Assessment Lower Extremity Assessment: Overall WFL for tasks assessed   Balance    End of  Session PT - End of Session Equipment Utilized During Treatment: Gait belt Activity Tolerance: Patient tolerated treatment well Patient left: in chair;with call bell/phone within reach Nurse Communication: Mobility status  GP     [facility] HELEN 08/21/2012, 10:39 AM

## 2012-08-21 NOTE — Evaluation (Signed)
Occupational Therapy Evaluation Patient Details Name: Deanna [name] MRN: [id number] DOB: 1950/06/30 Today's Date: 08/21/2012 Time: 0920-1000 OT Time Calculation (min): 40 min  OT Assessment / Plan / Recommendation History of present illness 62 y/o female s/p L4 Gill procedure, L4-5 Fusion.   Clinical Impression   Pt is a pleasant woman with supportive husband who PTA was doing yoga and independent in ADL and mobility. Pt educated on peri care, sink level grooming, setting things up on the right, and sticking to the "head to hip" rule of thumb to maintain precautions. Pt given BAT handout. Pt will benefit from continued skilled OT while in the acute setting to maximize independence with ADL and safety maintaining precautions bc d/c home with husband.    OT Assessment  Patient needs continued OT Services    Follow Up Recommendations  No OT follow up       Equipment Recommendations  None recommended by OT       Frequency  Min 2X/week    Precautions / Restrictions Precautions Precautions: Back Precaution Booklet Issued: Yes (comment) Precaution Comments: BAT handout Required Braces or Orthoses: Spinal Brace Spinal Brace: Lumbar corset;Applied in sitting position Restrictions Weight Bearing Restrictions: No   Pertinent Vitals/Pain Pt reported 5/10 pain    ADL  Eating/Feeding: Independent Where Assessed - Eating/Feeding: Edge of bed Grooming: Wash/dry hands;Modified independent Where Assessed - Grooming: Supported standing (UE) Upper Body Bathing: Supervision/safety Where Assessed - Upper Body Bathing: Supported standing Lower Body Bathing: Moderate assistance Where Assessed - Lower Body Bathing: Unsupported sitting Upper Body Dressing: Supervision/safety Where Assessed - Upper Body Dressing: Unsupported sitting Lower Body Dressing: Moderate assistance Where Assessed - Lower Body Dressing: Unsupported sitting Toilet Transfer: Supervision/safety Toilet Transfer Method:  Sit to stand Toilet Transfer Equipment: Comfort height toilet;Grab bars Toileting - Clothing Manipulation and Hygiene: Moderate assistance (bc of lines) Where Assessed - Toileting Clothing Manipulation and Hygiene: Standing Tub/Shower Transfer: Modified independent Tub/Shower Transfer Method: Science writer: Walk in shower Equipment Used: Back brace;Rolling walker Transfers/Ambulation Related to ADLs: Pt min guard/supervision for transfers, and supervision for ambulation. ADL Comments: Pt educated on peri care after toileting while maintaining precautions. Pt able to perform sink level ADL with UE support on sink. Pt educated on "head to hip" rule to prevent arching and bending, and setting things up one the right side of her body to prevent twisting during functional activities.    OT Treatment Interventions: Self-care/ADL training;DME and/or AE instruction;Therapeutic activities;Patient/family education   OT Goals(Current goals can be found in the care plan section) Acute Rehab OT Goals Patient Stated Goal: to go home today OT Goal Formulation: With patient/family Time For Goal Achievement: 09/04/12 Potential to Achieve Goals: Good ADL Goals Pt Will Perform Grooming: with modified independence;standing Pt Will Perform Lower Body Dressing: with modified independence;with caregiver independent in assisting;sit to/from stand Pt Will Transfer to Toilet: with modified independence;regular height toilet  Visit Information  Last OT Received On: 08/21/12 Assistance Needed: +1 History of Present Illness: 62 y/o female s/p L4 Gill procedure, L4-5 Fusion.       Prior Functioning     Home Living Family/patient expects to be discharged to:: Private residence Living Arrangements: Spouse/significant other Available Help at Discharge: Family;Available PRN/intermittently Type of Home: House Home Access: Stairs to enter [employer] of Steps: 4 Entrance  Stairs-Rails: Left Home Layout: One level Home Equipment: None Additional Comments: can borrow a RW from a friend if needed Prior Function Level of Independence: Independent Communication Communication: No  difficulties Dominant Hand: Right         Vision/Perception Vision - History Baseline Vision: Wears glasses all the time Patient Visual Report: No change from baseline   Cognition  Cognition Arousal/Alertness: Awake/alert Behavior During Therapy: WFL for tasks assessed/performed Overall Cognitive Status: Within Functional Limits for tasks assessed    Extremity/Trunk Assessment Upper Extremity Assessment Upper Extremity Assessment: Overall WFL for tasks assessed Lower Extremity Assessment Lower Extremity Assessment: Overall WFL for tasks assessed Cervical / Trunk Assessment Cervical / Trunk Assessment: Normal     Mobility Bed Mobility Bed Mobility: Rolling Left;Left Sidelying to Sit Rolling Left: 5: Supervision Left Sidelying to Sit: 5: Supervision;HOB flat Details for Bed Mobility Assistance: verbal sequencing cues Transfers Transfers: Sit to Stand;Stand to Sit Sit to Stand: 4: Min guard;With upper extremity assist;From bed Stand to Sit: 5: Supervision;With upper extremity assist;To chair/3-in-1 Details for Transfer Assistance: cues for safe hand placement, gaurding for follow through as pt slow to rise due to pain           End of Session OT - End of Session Equipment Utilized During Treatment: Gait belt;Rolling walker;Back brace Activity Tolerance: Patient tolerated treatment well Patient left: in chair;with call bell/phone within reach;with family/visitor present Nurse Communication: Mobility status;Precautions  GO     Sherryl Manges 08/21/2012, 11:03 AM

## 2012-08-21 NOTE — Plan of Care (Signed)
Problem: Consults Goal: Diagnosis - Spinal Surgery Outcome: Completed/Met Date Met:  08/21/12 Thoraco/Lumbar Spine Fusion

## 2012-08-21 NOTE — Progress Notes (Signed)
Patient discharged home as ordered. Patient moving all extremities well,voiding adequate amount of urine, incision with no s/s of infection, redness,or swelling. Script and lumbar surgery information given to patient and spouse at discharged.

## 2012-08-21 NOTE — Progress Notes (Signed)
Physical Therapy Treatment Patient Details Name: Deanna [name] MRN: [id number] DOB: 01-Aug-1950 Today's Date: 08/21/2012 Time: 4098-1191 PT Time Calculation (min): 10 min  PT Assessment / Plan / Recommendation  PT Comments   Session focused on bed mobility training. Patient instructed in how to get in and out of tall bed as well as car for transfer home today. Reviewed precautions and safety with regards to ambulation. Patient and Husband feel ready to d/c home today. Patient is moving very well.  Follow Up Recommendations  No PT follow up     Does the patient have the potential to tolerate intense rehabilitation     Barriers to Discharge Decreased caregiver support husband will be gone during the day but can check on her frequently    Equipment Recommendations  None recommended by PT    Recommendations for Other Services    Frequency Min 5X/week   Progress towards PT Goals Progress towards PT goals: Progressing toward goals  Plan Current plan remains appropriate    Precautions / Restrictions Precautions Precautions: Back Precaution Booklet Issued: Yes (comment) Precaution Comments: BAT handout Required Braces or Orthoses: Spinal Brace Spinal Brace: Lumbar corset;Applied in sitting position Restrictions Weight Bearing Restrictions: No   Pertinent Vitals/Pain Reports back soreness    Mobility  Bed Mobility Bed Mobility: Sit to Sidelying Left;Rolling Right Rolling Right: 6: Modified independent (Device/Increase time) Sit to Sidelying Left: 6: Modified independent (Device/Increase time)-HOB flat Details for Bed Mobility Assistance: verbal sequencing cues Transfers Transfers: Sit to Stand;Stand to Sit Sit to Stand: 5: Supervision;From chair/3-in-1;With upper extremity assist Stand to Sit: 6: Modified independent (Device/Increase time);To bed;With upper extremity assist Details for Transfer Assistance: cues for safe hand placement, gaurding for follow through as pt slow to  rise due to pain Ambulation/Gait Ambulation/Gait Assistance: 6: Modified independent (Device/Increase time) Ambulation Distance (Feet): 5 Feet Assistive device: Rolling walker Ambulation/Gait Assistance Details: no difficulties, ambulated from chair to bed to practice bed mobility Gait Pattern: Step-through pattern;Decreased stride length Gait velocity: decreased, cautious speed   Exercises     PT Diagnosis: Acute pain  PT Problem List: Decreased activity tolerance;Decreased knowledge of use of DME;Decreased knowledge of precautions;Pain PT Treatment Interventions: DME instruction;Gait training;Patient/family education;Therapeutic activities;Functional mobility training   PT Goals (current goals can now be found in the care plan section) Acute Rehab PT Goals Patient Stated Goal: to go home today PT Goal Formulation: With patient Time For Goal Achievement: 08/28/12 Potential to Achieve Goals: Good  Visit Information  Last PT Received On: 08/21/12 Assistance Needed: +1 PT/OT Co-Evaluation/Treatment: Yes History of Present Illness: 62 y/o female s/p L4 Gill procedure, L4-5 Fusion.    Subjective Data  Patient Stated Goal: to go home today   Cognition  Cognition Arousal/Alertness: Awake/alert Behavior During Therapy: WFL for tasks assessed/performed Overall Cognitive Status: Within Functional Limits for tasks assessed    Balance     End of Session PT - End of Session Equipment Utilized During Treatment: Gait belt Activity Tolerance: Patient tolerated treatment well Patient left: in bed;with call bell/phone within reach Nurse Communication: Mobility status   GP     [facility] HELEN 08/21/2012, 11:08 AM

## 2012-08-21 NOTE — Evaluation (Signed)
I agree with the following treatment note after reviewing documentation.   Johnston, Alahni Varone Brynn   OTR/L Pager: [phone] Office: [phone] .   

## 2012-10-23 ENCOUNTER — Telehealth: Payer: Self-pay | Admitting: Gynecology

## 2012-10-23 NOTE — Telephone Encounter (Signed)
Dr. Farrel Gobble can you place order for Vagifem 10 mcg?

## 2012-10-23 NOTE — Telephone Encounter (Signed)
Patient needs to have her Vagifem refilled  10 mcg. Patient Discussed this with Dr. Farrel Gobble at her last visit. This prescription was prescribed by another doctor that has retired.    [facility]  [address]

## 2012-10-25 ENCOUNTER — Other Ambulatory Visit: Payer: Self-pay | Admitting: Gynecology

## 2012-10-25 DIAGNOSIS — N952 Postmenopausal atrophic vaginitis: Secondary | ICD-10-CM

## 2012-10-25 MED ORDER — ESTRADIOL 10 MCG VA TABS: 10.0000 ug | ORAL_TABLET | VAGINAL | Status: DC

## 2012-10-25 NOTE — Telephone Encounter (Signed)
sent 

## 2012-10-28 NOTE — Telephone Encounter (Signed)
Message left on Voicemail stating "Deanna [name]" that refill was placed to [facility].

## 2012-11-11 ENCOUNTER — Telehealth: Payer: Self-pay | Admitting: Gynecology

## 2012-11-11 NOTE — Telephone Encounter (Signed)
LMTCB 11/11/12 cm 

## 2012-11-11 NOTE — Telephone Encounter (Signed)
Patient had bone density 10/25/12 and is waiting for results so she can schedule Reclast please?

## 2012-11-12 NOTE — Telephone Encounter (Signed)
Pt returning call

## 2012-11-13 NOTE — Telephone Encounter (Signed)
Spoke with patient, waiting for BMD results will call pt back, T/C to [facility] waiting for BMD  Results from 10/25/12

## 2012-11-13 NOTE — Telephone Encounter (Signed)
T/C to pt after receiving BMD results Dr. Farrel Gobble reviewed and ask that pt come in to discuss results And what to do next.  appt scheduled for 11/15/12 w/ Dr. Farrel Gobble

## 2012-11-15 ENCOUNTER — Encounter: Payer: Self-pay | Admitting: Gynecology

## 2012-11-15 ENCOUNTER — Ambulatory Visit (INDEPENDENT_AMBULATORY_CARE_PROVIDER_SITE_OTHER): Payer: BC Managed Care – PPO | Admitting: Gynecology

## 2012-11-15 VITALS — BP 100/80 | HR 78 | Resp 18 | Ht 62.0 in | Wt 110.0 lb

## 2012-11-15 DIAGNOSIS — M810 Age-related osteoporosis without current pathological fracture: Secondary | ICD-10-CM

## 2012-11-15 NOTE — Progress Notes (Signed)
Pt here to discuss her recent DEXA results. Pt's former MD retired and we had not received her results.  Review of the last 3 DEXA since 2010 showed essentially no change in mineralization or T score -2.9.  On review of history, pt reports that she had taken actonel, fosamax, boniva and most recently reclast but states that she was a poor responder to treatment.  She does take calcium and does recall that being checked in the past.  Per pt she had in the past refused colonoscopy but she does admit to some GI bloating and had questioned a history of celiac disease.  i informed pt that celiac is associated with osteoporosis and recommend she be evaluated, she is agreeable. In addition, we reivewed records from epic and do not see PTH,TSH and Ca-ordered today We suggested if evaluation is normal, considering a change in family of osteoportotic medications from bisphosphonates to RANKL-prolia, we briefly reviewed that differences between the 2 and she is agreeable.  We informed her the need for precertification and will begin that process after she is evaluated. She was given information to read.  BP 100/80  Pulse 78  Resp 18  Ht 5\' 2"  (1.575 m)  Wt 110 lb (49.896 kg)  BMI 20.11 kg/m2 General appearance: alert, cooperative and appears stated age Neurologic: Grossly normal   Length of time spent 27m

## 2012-11-16 LAB — TSH: TSH: 3.404 u[IU]/mL (ref 0.350–4.500)

## 2012-11-18 LAB — PTH, INTACT AND CALCIUM: PTH: 38.4 pg/mL (ref 14.0–72.0)

## 2012-11-19 ENCOUNTER — Telehealth: Payer: Self-pay | Admitting: *Deleted

## 2012-11-19 NOTE — Telephone Encounter (Signed)
Patient is returning Deanna [name]'s call. 

## 2012-11-19 NOTE — Telephone Encounter (Signed)
Patient returned call and notified of results and dr lathrop recommendation to proceed with Prolia. Patient states she was expecting to be referred to Dr Loreta Ave for evaluation for Celiac.  Advised will initiate Prolia precert and check on referral.  Order for referral was entered by Dr Farrel Gobble.    Carolynn, can you check on this referral? Thanks.

## 2012-11-19 NOTE — Telephone Encounter (Signed)
Message copied by Alisa Graff on Tue Nov 19, 2012  1:00 PM ------      Message from: Douglass Rivers      Created: Tue Nov 19, 2012 12:07 PM       Labs normal, discussed prolia with her, we should precert ------

## 2012-11-19 NOTE — Telephone Encounter (Signed)
Called patient with appointment from Dr. Kenna Gilbert office on Tuesday October 28 @1 :30pm. Patient will be out of town and needs a different day. I provided the patient with the direct phone number to Dr. Kenna Gilbert office so she could call them to reschedule. Patient agreeable.

## 2012-11-19 NOTE — Telephone Encounter (Signed)
Call to patient to review the results. LMTCB.

## 2012-12-17 ENCOUNTER — Telehealth: Payer: Self-pay | Admitting: Gynecology

## 2012-12-17 NOTE — Telephone Encounter (Signed)
Patient wants to schedule an appt for Prolia inj.(No CHART)

## 2012-12-18 NOTE — Telephone Encounter (Signed)
Dr. Farrel Gobble,   Do you want to proceed with precert for Prolia?

## 2012-12-18 NOTE — Telephone Encounter (Signed)
i thought [place] was doing that, but yes

## 2012-12-24 NOTE — Telephone Encounter (Signed)
Spoke with pt who was just checking in about the prolia process. Advised pt that we will be faxing the precert info as soon as Dr. Farrel Gobble reviews and signs form, then it will be a few days to hear back about her insurance coverage. We will call with that info as soon as we get it. Pt agreeable.

## 2012-12-26 ENCOUNTER — Other Ambulatory Visit: Payer: Self-pay | Admitting: Dermatology

## 2013-01-07 ENCOUNTER — Ambulatory Visit: Payer: BC Managed Care – PPO

## 2013-01-07 ENCOUNTER — Telehealth: Payer: Self-pay | Admitting: Orthopedic Surgery

## 2013-01-07 MED ORDER — DENOSUMAB 60 MG/ML ~~LOC~~ SOLN: 60.0000 mg | SUBCUTANEOUS | Status: DC

## 2013-01-07 NOTE — Telephone Encounter (Signed)
Lab Results  Component Value Date   CALCIUM 10.3 11/15/2012    Spoke with patient and lab appointment cancelled.

## 2013-01-07 NOTE — Telephone Encounter (Signed)
Spoke with pt about approval for prolia, with pt only owing a copay. Scheduled lab appt for calcium level 01-10-13 at 9:30. Scheduled nurse visit appt for injection 01-14-13 at 2 pm. Advised pt that drug would be ordered through mail order pharmacy and sent to her house the same day as her appt.   Dr. Farrel Gobble,  Can you enter order for calcium level?

## 2013-01-07 NOTE — Telephone Encounter (Signed)
Pt had calcium level done as eval for osteoporosis, please let pt know no further labs needed, can just come in with rx, please call pt

## 2013-01-10 ENCOUNTER — Other Ambulatory Visit: Payer: BC Managed Care – PPO

## 2013-01-14 ENCOUNTER — Telehealth: Payer: Self-pay | Admitting: Gynecology

## 2013-01-14 ENCOUNTER — Ambulatory Visit: Payer: BC Managed Care – PPO

## 2013-01-14 NOTE — Telephone Encounter (Signed)
Patient called and cancelled her Prolia injection appointment for today due to the RX was not delivered today. Patient will call back to reschedule once the RX arrives.

## 2013-01-16 NOTE — Telephone Encounter (Signed)
Pt. Is still waiting on Prolia to come in . Pt is worried something is wrong she would like to talk with the nurse.

## 2013-01-16 NOTE — Telephone Encounter (Signed)
[facility] [phone], they state that they have been trying to reach patient to set up delivery.  Spoke with patient. Advised her to call to set up delivery at above number.  She will call them and then call us back to set up appointment for injection.

## 2013-01-22 ENCOUNTER — Telehealth: Payer: Self-pay | Admitting: *Deleted

## 2013-01-22 NOTE — Telephone Encounter (Signed)
S/w patient scheduled her Prolia Injection for 01/23/13 @ 2:30

## 2013-01-22 NOTE — Telephone Encounter (Signed)
Calling patient to schedule Prolia Injection

## 2013-01-23 ENCOUNTER — Ambulatory Visit (INDEPENDENT_AMBULATORY_CARE_PROVIDER_SITE_OTHER): Payer: BC Managed Care – PPO | Admitting: *Deleted

## 2013-01-23 VITALS — BP 110/60 | HR 74 | Resp 16 | Ht 62.25 in | Wt 112.0 lb

## 2013-01-23 DIAGNOSIS — M810 Age-related osteoporosis without current pathological fracture: Secondary | ICD-10-CM

## 2013-01-23 MED ORDER — DENOSUMAB 60 MG/ML ~~LOC~~ SOLN
60.0000 mg | Freq: Once | SUBCUTANEOUS | Status: AC
  Administered 2013-01-23: 60 mg via SUBCUTANEOUS

## 2013-01-23 NOTE — Progress Notes (Signed)
First Prolia injection given, Prolia 60mg   Subcutaneous left lower Abd. Pt tolerated injection well. Next injection due 07/2013 Calcium level check scheduled 06/2013.

## 2013-02-26 NOTE — Telephone Encounter (Signed)
See next phone notes and OV notes from Dr Collene Mares.  Routed to provider for signature, encounter closed.

## 2013-06-23 ENCOUNTER — Other Ambulatory Visit (INDEPENDENT_AMBULATORY_CARE_PROVIDER_SITE_OTHER): Payer: BC Managed Care – PPO

## 2013-06-23 DIAGNOSIS — M810 Age-related osteoporosis without current pathological fracture: Secondary | ICD-10-CM

## 2013-06-25 ENCOUNTER — Other Ambulatory Visit: Payer: Self-pay | Admitting: Gynecology

## 2013-06-25 DIAGNOSIS — M810 Age-related osteoporosis without current pathological fracture: Secondary | ICD-10-CM

## 2013-06-25 LAB — CALCIUM: CALCIUM: 10.1 mg/dL (ref 8.4–10.5)

## 2013-06-26 ENCOUNTER — Telehealth: Payer: Self-pay | Admitting: Emergency Medicine

## 2013-06-26 NOTE — Telephone Encounter (Signed)
Last prolia 01/23/13. Spoke with patient and she has had insurance changes. Called prolia and requested new benefits review.   Advised patient. Will return call to schedule.

## 2013-06-26 NOTE — Telephone Encounter (Signed)
Message copied by Michele Mcalpine on Thu Jun 26, 2013  3:27 PM ------      Message from: Elveria Rising      Created: Thu Jun 26, 2013 12:17 PM       CA NORMAL CAN REPEAT PROLIA ------

## 2013-07-18 NOTE — Telephone Encounter (Signed)
Spoke with [facility]. They will check Members benefits and call her to obtain authorization to ship Prolia.  She will be out of town after 6/24. Advised to call us back when she authorizes shipment we will call her once received to set up prolia injection. She is agreeable.

## 2013-07-21 NOTE — Telephone Encounter (Signed)
Deanna [name] calling to find out if it's ok for pt's prolia to be sent here to office. Their number is [phone].

## 2013-07-21 NOTE — Telephone Encounter (Signed)
Spoke with [facility]. Prolia to be shipped for delivery tomorrow.    Patient due for Prolia 180 days from 01/23/13.

## 2013-07-22 NOTE — Telephone Encounter (Signed)
Prolia injection has arrived. Patient aware.  Last CA WNL, done 06/23/13 180 days from last injection done 01/23/14 is 07/22/13, so patient will be in window.  She is scheduled for nurse visit 06/22/13 at 10:00, patient aware.

## 2013-07-23 ENCOUNTER — Ambulatory Visit (INDEPENDENT_AMBULATORY_CARE_PROVIDER_SITE_OTHER): Payer: BC Managed Care – PPO | Admitting: *Deleted

## 2013-07-23 ENCOUNTER — Encounter: Payer: Self-pay | Admitting: *Deleted

## 2013-07-23 VITALS — BP 100/64 | HR 68 | Ht 62.25 in | Wt 110.0 lb

## 2013-07-23 DIAGNOSIS — M810 Age-related osteoporosis without current pathological fracture: Secondary | ICD-10-CM

## 2013-07-23 MED ORDER — DENOSUMAB 60 MG/ML ~~LOC~~ SOLN
60.0000 mg | Freq: Once | SUBCUTANEOUS | Status: AC
  Administered 2013-07-23: 60 mg via SUBCUTANEOUS

## 2013-07-23 NOTE — Progress Notes (Signed)
Patient ID: Deanna [name], female   DOB: 01-05-1951, 63 y.o.   MRN: [id number] Pt arrived for Prolia injection.  Last calcium was 06/23/13 and was  Normal.  Pt tolerated injection well in right abdomen.  Next injection due in 01/2014.  Calcium level to be checked in November.

## 2013-10-01 ENCOUNTER — Encounter: Payer: Self-pay | Admitting: Gynecology

## 2013-10-01 ENCOUNTER — Ambulatory Visit (INDEPENDENT_AMBULATORY_CARE_PROVIDER_SITE_OTHER): Payer: BC Managed Care – PPO | Admitting: Gynecology

## 2013-10-01 VITALS — BP 110/70 | HR 74 | Resp 12 | Ht 62.0 in | Wt 112.0 lb

## 2013-10-01 DIAGNOSIS — N952 Postmenopausal atrophic vaginitis: Secondary | ICD-10-CM

## 2013-10-01 DIAGNOSIS — Z01419 Encounter for gynecological examination (general) (routine) without abnormal findings: Secondary | ICD-10-CM

## 2013-10-01 DIAGNOSIS — M810 Age-related osteoporosis without current pathological fracture: Secondary | ICD-10-CM

## 2013-10-01 MED ORDER — ESTRADIOL 10 MCG VA TABS: 10.0000 ug | ORAL_TABLET | VAGINAL | Status: DC

## 2013-10-01 NOTE — Patient Instructions (Signed)
Call office regarding prolia 12/09/13

## 2013-10-01 NOTE — Progress Notes (Signed)
63 y.o. Married Caucasian female   G2P2002 here for annual exam. Pt reports menses are absent due to Menopause.  She does report hot flashes when she takes Vagifem, does not have night sweats, does have vaginal dryness.  She is using lubricants, Vagifem.  She does not report post-menopasual bleeding.  No dyspareunia.  Tolerating prolia well  No LMP recorded. Patient is postmenopausal.          Sexually active: Yes.    The current method of family planning is post menopausal status.    Exercising: Yes.    aerobics at home qd Last pap: 07/24/12 NEG HR HPV Abnormal PAP: no Mammogram:  08/15/13 Bi-Rads 1 BSE: yes  Colonoscopy: 01/08/13 f/u in 5-10 years DEXA: 10/15/12  Alcohol:  1-2 drinks/wk Tobacco: no  Labs: Antony Blackbird, MD   Health Maintenance  Topic Date Due  . Tetanus/tdap  04/27/1969  . Zostavax  04/28/2010  . Influenza Vaccine  09/06/2013  . Pap Smear  07/25/2015  . Mammogram  08/16/2015  . Colonoscopy  01/09/2023    Family History  Problem Relation Age of Onset  . Heart disease Mother   . Dementia Mother   . Hypertension Father   . Heart disease Father   . Other Father     Myothenia Gravis  . Melanoma Father   . Dementia Father     Patient Active Problem List   Diagnosis Date Noted  . Endometrial polyp   . Osteoporosis   . Atrophic vaginitis   . Spinal stenosis   . Fibromyalgia     Past Medical History  Diagnosis Date  . Endometrial polyp   . Osteoporosis   . Atrophic vaginitis   . Spinal stenosis   . Fibromyalgia   . Chronic urinary tract infection   . PONV (postoperative nausea and vomiting)   . GERD (gastroesophageal reflux disease)   . H/O hiatal hernia     Past Surgical History  Procedure Laterality Date  . Bunionectomy    . Tubal ligation    . Bartholin cyst marsupialization    . Dilation and curettage of uterus    . Facial cosmetic surgery    . Ear lobe surg    . Knee surgery      40 YEARS AGO ( LEFT)  . Back surgery  7/14     Allergies: Review of patient's allergies indicates no known allergies.  Current Outpatient Prescriptions  Medication Sig Dispense Refill  . acetaminophen (TYLENOL) 650 MG CR tablet Take 650 mg by mouth every 8 (eight) hours as needed for pain.      . calcium carbonate (OS-CAL) 600 MG TABS Take 600 mg by mouth daily with breakfast.      . carisoprodol (SOMA) 350 MG tablet Take 350 mg by mouth at bedtime.       . celecoxib (CELEBREX) 200 MG capsule Take 200 mg by mouth daily.      . cholecalciferol (VITAMIN D) 1000 UNITS tablet Take 1,000 Units by mouth daily.      Marland Kitchen denosumab (PROLIA) 60 MG/ML SOLN injection Inject 60 mg into the skin every 6 (six) months. Administer in upper arm, thigh, or abdomen  1 Syringe  1  . Estradiol (VAGIFEM) 10 MCG TABS vaginal tablet Place 1 tablet (10 mcg total) vaginally 2 (two) times a week.  24 tablet  4  . Magnesium 250 MG TABS Take 250 mg by mouth daily.       No current facility-administered medications for this  visit.    ROS: Pertinent items are noted in HPI.  Exam:    There were no vitals taken for this visit. Weight change: @WEIGHTCHANGE @ Last 3 height recordings:  Ht Readings from Last 3 Encounters:  07/23/13 5' 2.25" (1.581 m)  01/23/13 5' 2.25" (1.581 m)  11/15/12 5\' 2"  (1.575 m)   General appearance: alert, cooperative and appears stated age Head: Normocephalic, without obvious abnormality, atraumatic Neck: no adenopathy, no carotid bruit, no JVD, supple, symmetrical, trachea midline and thyroid not enlarged, symmetric, no tenderness/mass/nodules Lungs: clear to auscultation bilaterally Breasts: normal appearance, no masses or tenderness Heart: regular rate and rhythm, S1, S2 normal, no murmur, click, rub or gallop Abdomen: soft, non-tender; bowel sounds normal; no masses,  no organomegaly Extremities: extremities normal, atraumatic, no cyanosis or edema Skin: Skin color, texture, turgor normal. No rashes or lesions Lymph nodes:  Cervical, supraclavicular, and axillary nodes normal. no inguinal nodes palpated Neurologic: Grossly normal   Pelvic: External genitalia:  no lesions              Urethra: normal appearing urethra with no masses, tenderness or lesions              Bartholins and Skenes: Bartholin's, Urethra, Skene's normal                 Vagina: normal appearing vagina with normal color and discharge, no lesions              Cervix: normal appearance              Pap taken: No.        Bimanual Exam:  Uterus:  uterus is normal size, shape, consistency and nontender                                      Adnexa:    normal adnexa in size, nontender and no masses                                      Rectovaginal: Confirms                                      Anus:  normal sphincter tone, no lesions       1. Routine gynecological examination  counseled on breast self exam, mammography screening, osteoporosis return annually or prn Discussed PAP guideline changes, importance of weight bearing exercises, calcium, vit D and balanced diet. 2. Osteoporosis Pt is due for next prolia in 01/2014, will refill rx closer to date, she is aware regarding pre-medication labs Plan for DEXA after 2y approx 04/2015 Pt tolerating well Weight bearing exercise   3. Atrophic vaginitis   4. Vaginal atrophy  - Estradiol (VAGIFEM) 10 MCG TABS vaginal tablet; Place 1 tablet (10 mcg total) vaginally 2 (two) times a week.  Dispense: 24 tablet; Refill: 4  An After Visit Summary was printed and given to the patient.

## 2013-12-08 ENCOUNTER — Encounter: Payer: Self-pay | Admitting: Gynecology

## 2014-01-06 ENCOUNTER — Telehealth: Payer: Self-pay | Admitting: Emergency Medicine

## 2014-01-06 DIAGNOSIS — Z01812 Encounter for preprocedural laboratory examination: Secondary | ICD-10-CM

## 2014-01-06 NOTE — Telephone Encounter (Signed)
Patient is due for Prolia injection after 01/22/14. Last injection completed 07/23/13. Our office has received a refill request from [facility] to request refill and was faxed 12/23/13.  Calling [facility] to ensure they have received faxed order. Per rep Ellyn Hack, they have reached out to patient, but have received a return call. Per rep, they will need to recheck benefits since it is a new month now and she has started the process to recheck benefits within [facility]. [facility] phone: [phone]  They will attempt to reach the patient again, or patient can call to place order in 48 hours.  Order for pre injection calcium lab draw placed, patient will need lab appointment and injection within 30 days lab appointment.

## 2014-01-07 NOTE — Telephone Encounter (Signed)
Spoke with patient. Advised to call [facility] to authorize shipment.  Lab appointment scheduled for 01/19/14 for calcium level. Advised can have injection after 01/22/14. Patient agreeable to plan. Will call back with any questions.

## 2014-01-07 NOTE — Telephone Encounter (Signed)
Patient returning call.

## 2014-01-09 NOTE — Telephone Encounter (Signed)
Pharmacy calling to confirm delivery of Prolia.

## 2014-01-12 NOTE — Telephone Encounter (Signed)
Called [facility]. Spoke with Christy Sartorius. He states delivery will be done 01/20/14. Patient to have appointment after 01/22/14.  CA to be drawn 01/19/14.

## 2014-01-19 ENCOUNTER — Other Ambulatory Visit (INDEPENDENT_AMBULATORY_CARE_PROVIDER_SITE_OTHER): Payer: BC Managed Care – PPO

## 2014-01-19 DIAGNOSIS — Z01812 Encounter for preprocedural laboratory examination: Secondary | ICD-10-CM

## 2014-01-19 NOTE — Telephone Encounter (Signed)
Patient is asking when does she need to come in Prolia injection?

## 2014-01-20 LAB — CALCIUM: CALCIUM: 9.4 mg/dL (ref 8.4–10.5)

## 2014-01-20 NOTE — Telephone Encounter (Signed)
Patient returning call.

## 2014-01-20 NOTE — Telephone Encounter (Signed)
Injection is scheduled for 01-27-14.

## 2014-01-20 NOTE — Telephone Encounter (Signed)
Called patient and tried to schedule Prolia Injection she was in the car and said she will call me back when she gets home.

## 2014-01-20 NOTE — Telephone Encounter (Signed)
Schedule patient for 11/27/13 at 10:30 for prolia injection  Patient's last calcium level doen 01/19/14 at 9.4  Routed to provider for review, encounter closed. CC: Gay Filler, RN

## 2014-01-27 ENCOUNTER — Ambulatory Visit (INDEPENDENT_AMBULATORY_CARE_PROVIDER_SITE_OTHER): Payer: BC Managed Care – PPO | Admitting: *Deleted

## 2014-01-27 VITALS — BP 110/60 | HR 70 | Resp 14 | Ht 62.0 in | Wt 114.8 lb

## 2014-01-27 DIAGNOSIS — M810 Age-related osteoporosis without current pathological fracture: Secondary | ICD-10-CM

## 2014-01-27 MED ORDER — DENOSUMAB 60 MG/ML ~~LOC~~ SOLN
60.0000 mg | Freq: Once | SUBCUTANEOUS | Status: AC
  Administered 2014-01-27: 60 mg via SUBCUTANEOUS

## 2014-01-27 NOTE — Progress Notes (Signed)
Patient is here for Prolia Injection Last Calcium level 01/19/14 was 9.4 Patient tolerated injection well Next Injection due in June   Routed to provider for review, encounter closed. CC: Lavone Nian

## 2014-01-27 NOTE — Progress Notes (Signed)
Reviewed personally.  M. Suzanne Abrea Henle, MD.  

## 2014-03-12 ENCOUNTER — Ambulatory Visit: Payer: BC Managed Care – PPO | Admitting: Obstetrics & Gynecology

## 2014-07-14 ENCOUNTER — Telehealth: Payer: Self-pay | Admitting: Emergency Medicine

## 2014-07-14 DIAGNOSIS — Z01812 Encounter for preprocedural laboratory examination: Secondary | ICD-10-CM

## 2014-07-14 NOTE — Telephone Encounter (Signed)
Received incoming call from [facility] to request to set up delivery for Prolia injection. Requested to hold delivery at this time until patient can be contacted and lab appointment can be scheduled. Spoke with representative, Camilla. Once patient has lab work drawn, can call [phone] to request shipment.   Call to patient. She is aware she needs calcium level drawn. She is out of town and will return 07/30/14, lab appointment scheduled for that day for calcium lab draw. She will then be going out of town again and will call to coordinate her travel with prolia injection.   Last injection 01/28/15. Prior patient of Dr. Charlies Constable. Last Annual exam 09/2014. Last Dexa bone Density 10/25/12 at [facility].   Calcium level ordered for lab appointment.   Routing to provider for final review. Patient agreeable to disposition. Will close encounter.

## 2014-07-30 ENCOUNTER — Other Ambulatory Visit (INDEPENDENT_AMBULATORY_CARE_PROVIDER_SITE_OTHER): Payer: BLUE CROSS/BLUE SHIELD

## 2014-07-30 DIAGNOSIS — Z01812 Encounter for preprocedural laboratory examination: Secondary | ICD-10-CM

## 2014-07-30 LAB — CALCIUM: Calcium: 9.6 mg/dL (ref 8.4–10.5)

## 2014-08-04 ENCOUNTER — Telehealth: Payer: Self-pay | Admitting: Emergency Medicine

## 2014-08-04 NOTE — Telephone Encounter (Signed)
Call to patient with results. Patient verbalized understanding. She will be back in town on Thursday.   Called [facility] [phone] and spoke with representative, Sammy.  Who states she will contact patient to authorize delivery.

## 2014-08-04 NOTE — Telephone Encounter (Signed)
-----   Message from Megan Salon, MD sent at 07/31/2014  5:52 AM EDT ----- Calcium level normal.  Ok to proceed with prolia.

## 2014-08-06 NOTE — Telephone Encounter (Signed)
Spoke with Sharyn Lull at [facility]. Prolia set for delivery to office tomorrow 08/07/2014. Spoke with patient. Nurse visit for Prolia injection scheduled for 08/12/2014 at 10am. Patient is agreeable to date and time.  Routing to provider for final review. Patient agreeable to disposition. Will close encounter.

## 2014-08-06 NOTE — Telephone Encounter (Signed)
[facility] calling to speak with nurse to have patient's prolia delivered. Phone number is [phone].

## 2014-08-12 ENCOUNTER — Ambulatory Visit (INDEPENDENT_AMBULATORY_CARE_PROVIDER_SITE_OTHER): Payer: BLUE CROSS/BLUE SHIELD

## 2014-08-12 VITALS — BP 120/70 | HR 70 | Wt 112.8 lb

## 2014-08-12 DIAGNOSIS — M810 Age-related osteoporosis without current pathological fracture: Secondary | ICD-10-CM | POA: Diagnosis not present

## 2014-08-12 MED ORDER — DENOSUMAB 60 MG/ML ~~LOC~~ SOLN
60.0000 mg | Freq: Once | SUBCUTANEOUS | Status: AC
  Administered 2014-08-12: 60 mg via SUBCUTANEOUS

## 2014-08-12 NOTE — Progress Notes (Signed)
  Patient is here for Prolia Injection Last Calcium 07/30/14 was 9.6 Patient tolerated injection well Next Injection due in January Pt. Is aware to schedule appt. In Jan.  Routed to provider for review, encounter closed.

## 2014-08-12 NOTE — Progress Notes (Signed)
Encounter reviewed by Dr. Swayze Kozuch Amundson C. Silva.  

## 2014-10-14 ENCOUNTER — Ambulatory Visit (INDEPENDENT_AMBULATORY_CARE_PROVIDER_SITE_OTHER): Payer: BLUE CROSS/BLUE SHIELD | Admitting: Obstetrics and Gynecology

## 2014-10-14 ENCOUNTER — Encounter: Payer: Self-pay | Admitting: Obstetrics and Gynecology

## 2014-10-14 VITALS — BP 110/58 | HR 80 | Resp 14 | Ht 61.5 in | Wt 112.0 lb

## 2014-10-14 DIAGNOSIS — R140 Abdominal distension (gaseous): Secondary | ICD-10-CM

## 2014-10-14 DIAGNOSIS — Z01419 Encounter for gynecological examination (general) (routine) without abnormal findings: Secondary | ICD-10-CM

## 2014-10-14 DIAGNOSIS — R1010 Upper abdominal pain, unspecified: Secondary | ICD-10-CM | POA: Diagnosis not present

## 2014-10-14 DIAGNOSIS — M810 Age-related osteoporosis without current pathological fracture: Secondary | ICD-10-CM | POA: Diagnosis not present

## 2014-10-14 DIAGNOSIS — Z0000 Encounter for general adult medical examination without abnormal findings: Secondary | ICD-10-CM | POA: Diagnosis not present

## 2014-10-14 LAB — HEMOCCULT GUIAC POC 1CARD (OFFICE)
Card #2 Fecal Occult Blod, POC: NEGATIVE
Fecal Occult Blood, POC: NEGATIVE

## 2014-10-14 LAB — COMPREHENSIVE METABOLIC PANEL
ALBUMIN: 4.6 g/dL (ref 3.6–5.1)
ALT: 12 U/L (ref 6–29)
AST: 19 U/L (ref 10–35)
Alkaline Phosphatase: 39 U/L (ref 33–130)
BILIRUBIN TOTAL: 0.5 mg/dL (ref 0.2–1.2)
BUN: 19 mg/dL (ref 7–25)
CALCIUM: 9.5 mg/dL (ref 8.6–10.4)
CO2: 28 mmol/L (ref 20–31)
CREATININE: 0.63 mg/dL (ref 0.50–0.99)
Chloride: 101 mmol/L (ref 98–110)
Glucose, Bld: 87 mg/dL (ref 65–99)
Potassium: 4.3 mmol/L (ref 3.5–5.3)
SODIUM: 141 mmol/L (ref 135–146)
TOTAL PROTEIN: 6.7 g/dL (ref 6.1–8.1)

## 2014-10-14 LAB — BILIRUBIN, TOTAL: BILIRUBIN TOTAL: 0.5 mg/dL (ref 0.2–1.2)

## 2014-10-14 LAB — AMYLASE: Amylase: 38 U/L (ref 0–105)

## 2014-10-14 LAB — LIPASE: Lipase: 36 U/L (ref 7–60)

## 2014-10-14 LAB — BILIRUBIN, DIRECT: Bilirubin, Direct: 0.1 mg/dL (ref <=0.2)

## 2014-10-14 MED ORDER — DENOSUMAB 60 MG/ML ~~LOC~~ SOLN: 60.0000 mg | SUBCUTANEOUS | Status: DC

## 2014-10-14 NOTE — Addendum Note (Signed)
Addended by: Elroy Channel on: 10/14/2014 03:41 PM   Modules accepted: Orders, SmartSet

## 2014-10-14 NOTE — Progress Notes (Signed)
Patient ID: Deanna [name], female   DOB: 1950/11/03, 63 y.o.   MRN: [id number] 64 y.o. [id number] MarriedCaucasianF here for annual exam.  She c/o upper abdominal bloating, occasional RUQ pain for the last 6 months, not worsening. No nausea or vomiting, no change with her diet. Normal bowel function (except for the last 3 days, she has been constipated). She does have a lot of heartburn. No vaginal bleeding. Sexually active, uses vagifem 1 x a week, not needing lubricant.     No LMP recorded. Patient is postmenopausal.          Sexually active: Yes.    The current method of family planning is post menopausal status.    Exercising: Yes.    yoga Smoker:  no  Health Maintenance: Pap:  07/24/12 WNL NEG HR HPV  History of abnormal Pap:  no MMG:  09-02-14 4 mm cyst in RT breast- probably benign repeat MM and U/S in 6 mos Colonoscopy:  01-08-13 polyps (f/u in 5 years) BMD:   10-25-12 osteoporosis  TDaP:  UTD   reports that she has never smoked. She has never used smokeless tobacco. She reports that she drinks about 1.0 oz of alcohol per week. She reports that she does not use illicit drugs.  Past Medical History  Diagnosis Date  . Endometrial polyp   . Osteoporosis   . Atrophic vaginitis   . Spinal stenosis   . Fibromyalgia   . Chronic urinary tract infection   . PONV (postoperative nausea and vomiting)   . GERD (gastroesophageal reflux disease)   . H/O hiatal hernia     Past Surgical History  Procedure Laterality Date  . Bunionectomy    . Tubal ligation    . Bartholin cyst marsupialization    . Dilation and curettage of uterus    . Facial cosmetic surgery    . Ear lobe surg    . Knee surgery      40 YEARS AGO ( LEFT)  . Back surgery  7/14    lumbar fusion    Current Outpatient Prescriptions  Medication Sig Dispense Refill  . acetaminophen (TYLENOL) 650 MG CR tablet Take 500 mg by mouth every 8 (eight) hours as needed for pain.     . calcium carbonate (OS-CAL) 600 MG TABS Take 600  mg by mouth daily with breakfast.    . carisoprodol (SOMA) 350 MG tablet Take 350 mg by mouth at bedtime.     . celecoxib (CELEBREX) 200 MG capsule Take 200 mg by mouth daily.    . cholecalciferol (VITAMIN D) 1000 UNITS tablet Take 1,000 Units by mouth daily.    Marland Kitchen denosumab (PROLIA) 60 MG/ML SOLN injection Inject 60 mg into the skin every 6 (six) months. Administer in upper arm, thigh, or abdomen 1 Syringe 1  . Estradiol (VAGIFEM) 10 MCG TABS vaginal tablet Place 1 tablet (10 mcg total) vaginally 2 (two) times a week. 24 tablet 4  . Magnesium 250 MG TABS Take 250 mg by mouth daily.     No current facility-administered medications for this visit.    Family History  Problem Relation Age of Onset  . Heart disease Mother   . Dementia Mother   . Hypertension Father   . Heart disease Father   . Other Father     Myothenia Gravis  . Melanoma Father   . Dementia Father     Review of Systems  Constitutional: Negative.   HENT: Negative.   Eyes: Negative.  Respiratory: Negative.   Cardiovascular: Negative.   Gastrointestinal: Negative.        Bloating  Endocrine: Negative.   Genitourinary: Negative.   Musculoskeletal: Negative.   Skin: Negative.   Allergic/Immunologic: Negative.   Neurological: Negative.   Psychiatric/Behavioral: Negative.     Exam:   BP 110/58 mmHg  Pulse 80  Resp 14  Ht 5' 1.5" (1.562 m)  Wt 112 lb (50.803 kg)  BMI 20.82 kg/m2  Weight change: @WEIGHTCHANGE @ Height:   Height: 5' 1.5" (156.2 cm)  Ht Readings from Last 3 Encounters:  10/14/14 5' 1.5" (1.562 m)  01/27/14 5\' 2"  (1.575 m)  10/01/13 5\' 2"  (1.575 m)    General appearance: alert, cooperative and appears stated age Head: Normocephalic, without obvious abnormality, atraumatic Neck: no adenopathy, supple, symmetrical, trachea midline and thyroid normal to inspection and palpation Lungs: clear to auscultation bilaterally Breasts: normal appearance, no masses or tenderness Heart: regular rate and  rhythm Abdomen: soft, mildly tender in the epigastric region. Moderately distended. NABS, no masses or organomegaly. No shifting dullness.  Extremities: extremities normal, atraumatic, no cyanosis or edema Skin: Skin color, texture, turgor normal. No rashes or lesions Lymph nodes: Cervical, supraclavicular, and axillary nodes normal. No abnormal inguinal nodes palpated Neurologic: Grossly normal   Pelvic: External genitalia:  no lesions              Urethra:  normal appearing urethra with no masses, tenderness or lesions              Bartholins and Skenes: normal                 Vagina: normal appearing vagina with normal color and discharge, no lesions. Atrophic              Cervix: no lesions               Bimanual Exam:  Uterus:  normal size, contour, position, consistency, mobility, non-tender              Adnexa: no mass, fullness, tenderness               Rectovaginal: Confirms               Anus:  normal sphincter tone, no lesions. Stool guiac negative.  Chaperone was present for exam.  A:  Well Woman with normal exam  Abdominal bloating  Osteoporosis    P:   Pap next year  Mammogram and colonoscopy are UTD  DEXA this month  She will call when she needs more vagifem  Continue prolia  CMP, bili, amylase, lipase, vit D  Discussed watching her diet and her symptoms, she can try eliminating dairy, then try eliminating gluten for a few weeks to see if it helps  F/U with her primary if her symptoms persist  Cc: Dr Karrie Doffing   In addition to the annual exam, 10 minutes was spent in evaluation of abdominal bloating and pain, > 50 % in counseling.

## 2014-10-15 LAB — VITAMIN D 25 HYDROXY (VIT D DEFICIENCY, FRACTURES): VIT D 25 HYDROXY: 26 ng/mL — AB (ref 30–100)

## 2014-11-23 ENCOUNTER — Other Ambulatory Visit: Payer: Self-pay | Admitting: Family Medicine

## 2014-11-23 DIAGNOSIS — R1011 Right upper quadrant pain: Secondary | ICD-10-CM

## 2014-11-23 DIAGNOSIS — R1013 Epigastric pain: Secondary | ICD-10-CM

## 2014-12-02 ENCOUNTER — Ambulatory Visit
Admission: RE | Admit: 2014-12-02 | Discharge: 2014-12-02 | Disposition: A | Discharge status: Home / Self Care | Payer: BLUE CROSS/BLUE SHIELD | Source: Ambulatory Visit | Attending: Family Medicine | Admitting: Family Medicine

## 2014-12-02 DIAGNOSIS — R1013 Epigastric pain: Secondary | ICD-10-CM

## 2014-12-02 DIAGNOSIS — R1011 Right upper quadrant pain: Secondary | ICD-10-CM

## 2014-12-09 ENCOUNTER — Telehealth: Payer: Self-pay | Admitting: Cardiology

## 2014-12-09 NOTE — Telephone Encounter (Signed)
Received records from [facility] for appointment on 12/22/14 with Dr Percival Spanish.  Records given to [facility] (medical records) for Dr Hochrein's schedule on 12/22/14. lp

## 2014-12-11 ENCOUNTER — Telehealth: Payer: Self-pay | Admitting: Obstetrics and Gynecology

## 2014-12-11 DIAGNOSIS — M810 Age-related osteoporosis without current pathological fracture: Secondary | ICD-10-CM

## 2014-12-11 NOTE — Telephone Encounter (Signed)
Patient notified of Dexa results and agreeable to referral to Dr. Trudie Reed. Advised referral coordinator will contact her with appointment.  Patient agreeable.  Routing to provider for final review. Patient agreeable to disposition. Will close encounter.

## 2014-12-11 NOTE — Telephone Encounter (Signed)
Reviewed DEXA report and comparison from prior DEXA. Her T score is stable at -2.9, but she has had up to a -20% loss in BMD. She has been on multiple medications in the past. I think she needs to see a Rheumatology, Please set her up to see Dr Gavin Pound (I will place the referral).

## 2014-12-15 ENCOUNTER — Telehealth: Payer: Self-pay | Admitting: Obstetrics and Gynecology

## 2014-12-15 NOTE — Telephone Encounter (Signed)
[facility] calling regarding referral to Dr.Hawkes. Dr.Hawkes in no longer at [facility] and [facility] is asking if it is okay for patient to see another doctor at [facility].

## 2014-12-15 NOTE — Telephone Encounter (Signed)
Forwarded message to   Kerry Hough for  Referrals co-ordination and Dr. Trudie Reed new contact information.

## 2014-12-22 ENCOUNTER — Encounter: Payer: Self-pay | Admitting: Cardiology

## 2014-12-22 ENCOUNTER — Ambulatory Visit (INDEPENDENT_AMBULATORY_CARE_PROVIDER_SITE_OTHER): Payer: BLUE CROSS/BLUE SHIELD | Admitting: Cardiology

## 2014-12-22 VITALS — BP 116/70 | HR 60 | Ht 62.0 in | Wt 115.5 lb

## 2014-12-22 DIAGNOSIS — R079 Chest pain, unspecified: Secondary | ICD-10-CM

## 2014-12-22 NOTE — Patient Instructions (Signed)
Your physician recommends that you schedule a follow-up appointment As Needed  Your physician recommends that you have a Coronary Calcium Scoring Test

## 2014-12-22 NOTE — Progress Notes (Signed)
Cardiology Office Note   Date:  12/22/2014   ID:  Deanna [name], DOB 1951/02/04, MRN [id number]  PCP:  Antony Blackbird, MD  Cardiologist:   Minus Breeding, MD   Chief Complaint  Patient presents with  . Chest Pain      History of Present Illness: Deanna [name] is a 64 y.o. female who presents for for evaluation of chest discomfort. She has no prior cardiac history. She says for about 6 months she's been getting some discomfort. He describes it as a tightness. There can be some discomfort in her mid chest or maybe left upper chest. It might be 3 out of 10 in intensity. It only lasts for about 30 seconds. Sometimes she feels like she needs to cough. She might get a little lightheaded. It happens at rest. She cannot bring it on. There is no associated nausea vomiting or diaphoresis. She does not get short of breath. She does not have positional discomfort change with deep breathing. She does get frequent headaches and she was having these seem to be associated. She has not had any prior cardiac workup. She does yoga and housework and cannot bring on any symptoms.    Past Medical History  Diagnosis Date  . Endometrial polyp   . Osteoporosis   . Atrophic vaginitis   . Spinal stenosis   . Fibromyalgia   . Chronic urinary tract infection   . PONV (postoperative nausea and vomiting)   . GERD (gastroesophageal reflux disease)   . H/O hiatal hernia     Past Surgical History  Procedure Laterality Date  . Bunionectomy    . Tubal ligation    . Bartholin cyst marsupialization    . Dilation and curettage of uterus    . Facial cosmetic surgery    . Ear lobe surg    . Knee surgery      40 YEARS AGO ( LEFT)  . Back surgery  7/14    lumbar fusion     Current Outpatient Prescriptions  Medication Sig Dispense Refill  . acetaminophen (TYLENOL) 500 MG tablet Take 500 mg by mouth every 8 (eight) hours as needed.    . calcium carbonate (OS-CAL) 600 MG TABS Take 600 mg by mouth daily  with breakfast.    . carisoprodol (SOMA) 350 MG tablet Take 350 mg by mouth at bedtime.     . celecoxib (CELEBREX) 200 MG capsule Take 200 mg by mouth daily.    . cholecalciferol (VITAMIN D) 1000 UNITS tablet Take 1,000 Units by mouth daily.    . Estradiol (VAGIFEM) 10 MCG TABS vaginal tablet Place 1 tablet (10 mcg total) vaginally 2 (two) times a week. 24 tablet 4  . Magnesium 250 MG TABS Take 250 mg by mouth daily.     No current facility-administered medications for this visit.    Allergies:   Ciprofloxacin    Social History:  The patient  reports that she has never smoked. She has never used smokeless tobacco. She reports that she drinks about 1.0 oz of alcohol per week. She reports that she does not use illicit drugs.   Family History:  The patient's family history includes CAD (age of onset: 63) in her maternal grandfather; CAD (age of onset: 64) in her mother; Dementia in her father and mother; Heart disease in her father; Hypertension in her father; Melanoma in her father; Other in her father.    ROS:  Please see the history of present illness.  Otherwise, review of systems are positive for none.   All other systems are reviewed and negative.    PHYSICAL EXAM: VS:  BP 116/70 mmHg  Pulse 60  Ht 5\' 2"  (1.575 m)  Wt 115 lb 8 oz (52.39 kg)  BMI 21.12 kg/m2 , BMI Body mass index is 21.12 kg/(m^2). GENERAL:  Well appearing HEENT:  Pupils equal round and reactive, fundi not visualized, oral mucosa unremarkable NECK:  No jugular venous distention, waveform within normal limits, carotid upstroke brisk and symmetric, no bruits, no thyromegaly LYMPHATICS:  No cervical, inguinal adenopathy LUNGS:  Clear to auscultation bilaterally BACK:  No CVA tenderness CHEST:  Unremarkable HEART:  PMI not displaced or sustained,S1 and S2 within normal limits, no S3, no S4, no clicks, no rubs, no murmurs ABD:  Flat, positive bowel sounds normal in frequency in pitch, no bruits, no rebound, no  guarding, no midline pulsatile mass, no hepatomegaly, no splenomegaly EXT:  2 plus pulses throughout, no edema, no cyanosis no clubbing SKIN:  No rashes no nodules NEURO:  Cranial nerves II through XII grossly intact, motor grossly intact throughout PSYCH:  Cognitively intact, oriented to person place and time    EKG:  EKG is ordered today. The ekg ordered today demonstrates sinus rhythm, rate 60, axis within normal limits, intervals within normal limits, no acute ST-T wave changes.   Recent Labs: 10/14/2014: ALT 12; BUN 19; Creat 0.63; Potassium 4.3; Sodium 141    Lipid Panel No results found for: CHOL, TRIG, HDL, CHOLHDL, VLDL, LDLCALC, LDLDIRECT    Wt Readings from Last 3 Encounters:  12/22/14 115 lb 8 oz (52.39 kg)  10/14/14 112 lb (50.803 kg)  08/12/14 112 lb 12.8 oz (51.166 kg)      Other studies Reviewed: Additional studies/ records that were reviewed today include: Office records from FULP, CAMMIE, MD. Review of the above records demonstrates:  Please see elsewhere in the note.     ASSESSMENT AND PLAN:  CHEST PAIN: I think her symptoms are atypical. He does describe some symptoms consistent with palpitations and we discussed this. She would first try to reduce her caffeine which would include chocolate. She does have early coronary artery disease in her grandfather. I think screening her with a coronary calcium score would be helpful. Further management and risk reduction would be based on this result.     Current medicines are reviewed at length with the patient today.  The patient does not have concerns regarding medicines.  The following changes have been made:  no change  Labs/ tests ordered today include:    Orders Placed This Encounter  Procedures  . CT Cardiac Scoring  . EKG 12-Lead     Disposition:   FU with me as needed.      Signed, Minus Breeding, MD  12/22/2014 4:31 PM    [facility]

## 2014-12-28 ENCOUNTER — Ambulatory Visit (INDEPENDENT_AMBULATORY_CARE_PROVIDER_SITE_OTHER)
Admission: RE | Admit: 2014-12-28 | Discharge: 2014-12-28 | Disposition: A | Discharge status: Home / Self Care | Payer: BLUE CROSS/BLUE SHIELD | Source: Ambulatory Visit | Attending: Cardiology | Admitting: Cardiology

## 2014-12-28 DIAGNOSIS — R079 Chest pain, unspecified: Secondary | ICD-10-CM

## 2015-01-07 ENCOUNTER — Telehealth: Payer: Self-pay | Admitting: Obstetrics and Gynecology

## 2015-01-07 NOTE — Telephone Encounter (Signed)
DEXA reviewed, given that her osteoporosis is worsening on prolia and she has been on biphosphonates and reclast in the past she should have a consultation with a rheumatologist.

## 2015-01-12 NOTE — Telephone Encounter (Signed)
Spoke with patient. She states she saw Dr. Trudie Reed on 12/21/14 and drug holiday was recommended for next two years until next bone density.

## 2015-02-12 ENCOUNTER — Ambulatory Visit: Payer: BLUE CROSS/BLUE SHIELD

## 2015-02-18 ENCOUNTER — Other Ambulatory Visit: Payer: Self-pay | Admitting: Obstetrics and Gynecology

## 2015-02-18 DIAGNOSIS — N952 Postmenopausal atrophic vaginitis: Secondary | ICD-10-CM

## 2015-02-18 NOTE — Telephone Encounter (Signed)
Patient calling requesting refills on her Vagifem. Pharmacy on file is correct.

## 2015-02-19 MED ORDER — ESTRADIOL 10 MCG VA TABS: 10.0000 ug | ORAL_TABLET | VAGINAL | Status: DC

## 2015-02-19 NOTE — Telephone Encounter (Signed)
Ultrasound of breast was completed and is on second page behind Birads 0 MMG.  RF completed.

## 2015-02-19 NOTE — Telephone Encounter (Signed)
Medication refill request: vagifem  Last AEX:  10/14/14 JJ Next AEX: 10/20/15 JJ Last MMG (if hormonal medication request): 09/02/14 BIRADS0:Incomplete  Refill authorized: 10/01/13 #[id number]. Today please advise.  Routed to [facility]

## 2015-06-02 DIAGNOSIS — M62830 Muscle spasm of back: Secondary | ICD-10-CM | POA: Diagnosis not present

## 2015-06-02 DIAGNOSIS — Z13220 Encounter for screening for lipoid disorders: Secondary | ICD-10-CM | POA: Diagnosis not present

## 2015-06-02 DIAGNOSIS — Z791 Long term (current) use of non-steroidal anti-inflammatories (NSAID): Secondary | ICD-10-CM | POA: Diagnosis not present

## 2015-06-02 DIAGNOSIS — Z79899 Other long term (current) drug therapy: Secondary | ICD-10-CM | POA: Diagnosis not present

## 2015-06-02 DIAGNOSIS — M810 Age-related osteoporosis without current pathological fracture: Secondary | ICD-10-CM | POA: Diagnosis not present

## 2015-06-02 DIAGNOSIS — M5126 Other intervertebral disc displacement, lumbar region: Secondary | ICD-10-CM | POA: Diagnosis not present

## 2015-06-02 DIAGNOSIS — G894 Chronic pain syndrome: Secondary | ICD-10-CM | POA: Diagnosis not present

## 2015-06-02 DIAGNOSIS — E559 Vitamin D deficiency, unspecified: Secondary | ICD-10-CM | POA: Diagnosis not present

## 2015-06-09 DIAGNOSIS — E785 Hyperlipidemia, unspecified: Secondary | ICD-10-CM | POA: Diagnosis not present

## 2015-06-09 DIAGNOSIS — G894 Chronic pain syndrome: Secondary | ICD-10-CM | POA: Diagnosis not present

## 2015-06-09 DIAGNOSIS — Z791 Long term (current) use of non-steroidal anti-inflammatories (NSAID): Secondary | ICD-10-CM | POA: Diagnosis not present

## 2015-08-02 DIAGNOSIS — H2513 Age-related nuclear cataract, bilateral: Secondary | ICD-10-CM | POA: Diagnosis not present

## 2015-08-02 DIAGNOSIS — H43813 Vitreous degeneration, bilateral: Secondary | ICD-10-CM | POA: Diagnosis not present

## 2015-08-16 DIAGNOSIS — L812 Freckles: Secondary | ICD-10-CM | POA: Diagnosis not present

## 2015-08-16 DIAGNOSIS — L821 Other seborrheic keratosis: Secondary | ICD-10-CM | POA: Diagnosis not present

## 2015-08-16 DIAGNOSIS — Z85828 Personal history of other malignant neoplasm of skin: Secondary | ICD-10-CM | POA: Diagnosis not present

## 2015-08-16 DIAGNOSIS — D1801 Hemangioma of skin and subcutaneous tissue: Secondary | ICD-10-CM | POA: Diagnosis not present

## 2015-08-16 DIAGNOSIS — L438 Other lichen planus: Secondary | ICD-10-CM | POA: Diagnosis not present

## 2015-09-21 DIAGNOSIS — N6001 Solitary cyst of right breast: Secondary | ICD-10-CM | POA: Diagnosis not present

## 2015-09-30 ENCOUNTER — Encounter: Payer: Self-pay | Admitting: Obstetrics and Gynecology

## 2015-10-20 ENCOUNTER — Ambulatory Visit (INDEPENDENT_AMBULATORY_CARE_PROVIDER_SITE_OTHER): Payer: Medicare Other | Admitting: Obstetrics and Gynecology

## 2015-10-20 ENCOUNTER — Encounter: Payer: Self-pay | Admitting: Obstetrics and Gynecology

## 2015-10-20 VITALS — BP 122/70 | HR 76 | Resp 13 | Ht 61.75 in | Wt 114.0 lb

## 2015-10-20 DIAGNOSIS — Z01419 Encounter for gynecological examination (general) (routine) without abnormal findings: Secondary | ICD-10-CM | POA: Diagnosis not present

## 2015-10-20 DIAGNOSIS — M810 Age-related osteoporosis without current pathological fracture: Secondary | ICD-10-CM | POA: Diagnosis not present

## 2015-10-20 DIAGNOSIS — Z124 Encounter for screening for malignant neoplasm of cervix: Secondary | ICD-10-CM | POA: Diagnosis not present

## 2015-10-20 NOTE — Patient Instructions (Signed)
EXERCISE AND DIET:  We recommended that you start or continue a regular exercise program for good health. Regular exercise means any activity that makes your heart beat faster and makes you sweat.  We recommend exercising at least 30 minutes per day at least 3 days a week, preferably 4 or 5.  We also recommend a diet low in fat and sugar.  Inactivity, poor dietary choices and obesity can cause diabetes, heart attack, stroke, and kidney damage, among others.    ALCOHOL AND SMOKING:  Women should limit their alcohol intake to no more than 7 drinks/beers/glasses of wine (combined, not each!) per week. Moderation of alcohol intake to this level decreases your risk of breast cancer and liver damage. And of course, no recreational drugs are part of a healthy lifestyle.  And absolutely no smoking or even second hand smoke. Most people know smoking can cause heart and lung diseases, but did you know it also contributes to weakening of your bones? Aging of your skin?  Yellowing of your teeth and nails?  CALCIUM AND VITAMIN D:  Adequate intake of calcium and Vitamin D are recommended.  The recommendations for exact amounts of these supplements seem to change often, but generally speaking 600 mg of calcium (either carbonate or citrate) and 800 units of Vitamin D per day seems prudent. Certain women may benefit from higher intake of Vitamin D.  If you are among these women, your doctor will have told you during your visit.    PAP SMEARS:  Pap smears, to check for cervical cancer or precancers,  have traditionally been done yearly, although recent scientific advances have shown that most women can have pap smears less often.  However, every woman still should have a physical exam from her gynecologist every year. It will include a breast check, inspection of the vulva and vagina to check for abnormal growths or skin changes, a visual exam of the cervix, and then an exam to evaluate the size and shape of the uterus and  ovaries.  And after 65 years of age, a rectal exam is indicated to check for rectal cancers. We will also provide age appropriate advice regarding health maintenance, like when you should have certain vaccines, screening for sexually transmitted diseases, bone density testing, colonoscopy, mammograms, etc.   MAMMOGRAMS:  All women over 40 years old should have a yearly mammogram. Many facilities now offer a "3D" mammogram, which may cost around $50 extra out of pocket. If possible,  we recommend you accept the option to have the 3D mammogram performed.  It both reduces the number of women who will be called back for extra views which then turn out to be normal, and it is better than the routine mammogram at detecting truly abnormal areas.    COLONOSCOPY:  Colonoscopy to screen for colon cancer is recommended for all women at age 50.  We know, you hate the idea of the prep.  We agree, BUT, having colon cancer and not knowing it is worse!!  Colon cancer so often starts as a polyp that can be seen and removed at colonscopy, which can quite literally save your life!  And if your first colonoscopy is normal and you have no family history of colon cancer, most women don't have to have it again for 10 years.  Once every ten years, you can do something that may end up saving your life, right?  We will be happy to help you get it scheduled when you are ready.    Be sure to check your insurance coverage so you understand how much it will cost.  It may be covered as a preventative service at no cost, but you should check your particular policy.      Breast Self-Awareness Practicing breast self-awareness may pick up problems early, prevent significant medical complications, and possibly save your life. By practicing breast self-awareness, you can become familiar with how your breasts look and feel and if your breasts are changing. This allows you to notice changes early. It can also offer you some reassurance that your  breast health is good. One way to learn what is normal for your breasts and whether your breasts are changing is to do a breast self-exam. If you find a lump or something that was not present in the past, it is best to contact your caregiver right away. Other findings that should be evaluated by your caregiver include nipple discharge, especially if it is bloody; skin changes or reddening; areas where the skin seems to be pulled in (retracted); or new lumps and bumps. Breast pain is seldom associated with cancer (malignancy), but should also be evaluated by a caregiver. HOW TO PERFORM A BREAST SELF-EXAM The best time to examine your breasts is 5-7 days after your menstrual period is over. During menstruation, the breasts are lumpier, and it may be more difficult to pick up changes. If you do not menstruate, have reached menopause, or had your uterus removed (hysterectomy), you should examine your breasts at regular intervals, such as monthly. If you are breastfeeding, examine your breasts after a feeding or after using a breast pump. Breast implants do not decrease the risk for lumps or tumors, so continue to perform breast self-exams as recommended. Talk to your caregiver about how to determine the difference between the implant and breast tissue. Also, talk about the amount of pressure you should use during the exam. Over time, you will become more familiar with the variations of your breasts and more comfortable with the exam. A breast self-exam requires you to remove all your clothes above the waist. 1. Look at your breasts and nipples. Stand in front of a mirror in a room with good lighting. With your hands on your hips, push your hands firmly downward. Look for a difference in shape, contour, and size from one breast to the other (asymmetry). Asymmetry includes puckers, dips, or bumps. Also, look for skin changes, such as reddened or scaly areas on the breasts. Look for nipple changes, such as discharge,  dimpling, repositioning, or redness. 2. Carefully feel your breasts. This is best done either in the shower or tub while using soapy water or when flat on your back. Place the arm (on the side of the breast you are examining) above your head. Use the pads (not the fingertips) of your three middle fingers on your opposite hand to feel your breasts. Start in the underarm area and use  inch (2 cm) overlapping circles to feel your breast. Use 3 different levels of pressure (light, medium, and firm pressure) at each circle before moving to the next circle. The light pressure is needed to feel the tissue closest to the skin. The medium pressure will help to feel breast tissue a little deeper, while the firm pressure is needed to feel the tissue close to the ribs. Continue the overlapping circles, moving downward over the breast until you feel your ribs below your breast. Then, move one finger-width towards the center of the body. Continue to use the    inch (2 cm) overlapping circles to feel your breast as you move slowly up toward the collar bone (clavicle) near the base of the neck. Continue the up and down exam using all 3 pressures until you reach the middle of the chest. Do this with each breast, carefully feeling for lumps or changes. 3.  Keep a written record with breast changes or normal findings for each breast. By writing this information down, you do not need to depend only on memory for size, tenderness, or location. Write down where you are in your menstrual cycle, if you are still menstruating. Breast tissue can have some lumps or thick tissue. However, see your caregiver if you find anything that concerns you.  SEEK MEDICAL CARE IF:  You see a change in shape, contour, or size of your breasts or nipples.   You see skin changes, such as reddened or scaly areas on the breasts or nipples.   You have an unusual discharge from your nipples.   You feel a new lump or unusually thick areas.     This information is not intended to replace advice given to you by your health care provider. Make sure you discuss any questions you have with your health care provider.   Document Released: 01/23/2005 Document Revised: 01/10/2012 Document Reviewed: 05/10/2011 Elsevier Interactive Patient Education 2016 [employer]  

## 2015-10-20 NOTE — Progress Notes (Signed)
65 y.o. [id number] MarriedCaucasianF here for annual exam.  No vaginal bleeding. She does have some bloating, better if she doesn't eat bread. No pain. Sexually active, no pain. Using vaginal estrogen 1 day awake.  She has a h/o osteoporosis, was on prolia. Saw Rheumatology last year who took her off, awaiting a f/u DEXA year. She is on calcium, vit d and is exercising. With fibromyalgia she has to limit exercise.    No LMP recorded. Patient is postmenopausal.          Sexually active: Yes.    The current method of family planning is post menopausal status.    Exercising: Yes.    Home exercise routine includes yoga and walking. Smoker:  no  Health Maintenance: Pap:  07-24-12 WNL NEG HR HPV History of abnormal Pap:  no MMG:  09-21-15 5 mm cyst in right breast- probably benign- repeat mammogram in 1 yr  Colonoscopy:  01-08-13 polyps BMD:   11-17-2014 Osteoporosis  TDaP:  Unsure, she will check with her primary Gardasil: N/A   reports that she has never smoked. She has never used smokeless tobacco. She reports that she drinks about 1.0 oz of alcohol per week . She reports that she does not use drugs.Retired. 2 kids, 4 biological grandchildren, 6 step-grandchildren.   Past Medical History:  Diagnosis Date  . Atrophic vaginitis   . Chronic urinary tract infection   . Endometrial polyp   . Fibromyalgia   . GERD (gastroesophageal reflux disease)   . H/O hiatal hernia   . Osteoporosis   . PONV (postoperative nausea and vomiting)   . Spinal stenosis     Past Surgical History:  Procedure Laterality Date  . BACK SURGERY  7/14   lumbar fusion  . BARTHOLIN CYST MARSUPIALIZATION    . BUNIONECTOMY    . DILATION AND CURETTAGE OF UTERUS    . ear lobe surg    . FACIAL COSMETIC SURGERY    . KNEE SURGERY     40 YEARS AGO ( LEFT)  . TUBAL LIGATION      Current Outpatient Prescriptions  Medication Sig Dispense Refill  . acetaminophen (TYLENOL) 500 MG tablet Take 500 mg by mouth every 8 (eight)  hours as needed.    . calcium carbonate (OS-CAL) 600 MG TABS Take 600 mg by mouth daily with breakfast.    . celecoxib (CELEBREX) 200 MG capsule Take 200 mg by mouth daily.    . cholecalciferol (VITAMIN D) 1000 UNITS tablet Take 1,000 Units by mouth daily.    . Estradiol (VAGIFEM) 10 MCG TABS vaginal tablet Place 1 tablet (10 mcg total) vaginally 2 (two) times a week. 24 tablet 3  . Magnesium 250 MG TABS Take 250 mg by mouth daily.    Marland Kitchen tiZANidine (ZANAFLEX) 4 MG capsule Take 4 mg by mouth daily.     No current facility-administered medications for this visit.     Family History  Problem Relation Age of Onset  . CAD Mother 33  . Dementia Mother   . Hypertension Father   . Heart disease Father     Pacemaker  . Other Father     Myothenia Gravis  . Melanoma Father   . Dementia Father   . CAD Maternal Grandfather 78    Died of MI    Review of Systems  Constitutional: Negative.   HENT: Negative.   Eyes: Negative.   Respiratory: Negative.   Cardiovascular: Negative.   Gastrointestinal: Negative.   Endocrine: Negative.  Genitourinary: Negative.   Musculoskeletal: Negative.   Skin: Negative.   Allergic/Immunologic: Negative.   Neurological: Negative.   Psychiatric/Behavioral: Negative.     Exam:   BP 122/70 (BP Location: Right Arm, Patient Position: Sitting, Cuff Size: Normal)   Pulse 76   Resp 13   Ht 5' 1.75" (1.568 m)   Wt 114 lb (51.7 kg)   BMI 21.02 kg/m   Weight change: @WEIGHTCHANGE @ Height:   Height: 5' 1.75" (156.8 cm)  Ht Readings from Last 3 Encounters:  10/20/15 5' 1.75" (1.568 m)  12/22/14 5\' 2"  (1.575 m)  10/14/14 5' 1.5" (1.562 m)    General appearance: alert, cooperative and appears stated age Head: Normocephalic, without obvious abnormality, atraumatic Neck: no adenopathy, supple, symmetrical, trachea midline and thyroid normal to inspection and palpation Lungs: clear to auscultation bilaterally Breasts: normal appearance, no masses or  tenderness Heart: regular rate and rhythm Abdomen: soft, non-tender; bowel sounds normal; no masses,  no organomegaly Extremities: extremities normal, atraumatic, no cyanosis or edema Skin: Skin color, texture, turgor normal. No rashes or lesions Lymph nodes: Cervical, supraclavicular, and axillary nodes normal. No abnormal inguinal nodes palpated Neurologic: Grossly normal   Pelvic: External genitalia:  no lesions              Urethra:  normal appearing urethra with no masses, tenderness or lesions              Bartholins and Skenes: normal                 Vagina: normal appearing atrophic vagina with normal color and discharge, no lesions              Cervix: no lesions               Bimanual Exam:  Uterus:  normal size, contour, position, consistency, mobility, non-tender              Adnexa: no mass, fullness, tenderness               Rectovaginal: Confirms               Anus:  normal sphincter tone, no lesions  Chaperone was present for exam.  A:  Well Woman with normal exam  Osteoporosis, taking a break from prolia (need to get the notes from Rheumatology)    P:   Pap with hpv declined today, she wants to wait 2 more years  Discussed calcium, vit d and exercise  She will check with her primary on her last vit d level  Mammogram UTD  Colonoscopy due in 12/18  Labs and immunizations with primary  She will call when she needs vagifem (may stop using, she will try just lubrication)

## 2015-12-09 DIAGNOSIS — M797 Fibromyalgia: Secondary | ICD-10-CM | POA: Diagnosis not present

## 2016-03-18 IMAGING — CT CT HEART SCORING
1 of 3 series · 10 of 20 positions shown, 13 images · non-contrast
Comparison: Chest radiograph 09/17/2008

CLINICAL DATA: CLINICAL DATA
Risk stratification

EXAM:
Coronary Calcium Score
TECHNIQUE: The patient was scanned on a Siemens Sensation 16 slice scanner.
Axial non-contrast 3mm slices were carried out through the heart.
The data set was analyzed on a dedicated work station and scored
using the Agatson method.

[Series 6: st thins for reformat · axial · 0.56mm/px · z∈[-210,-111]mm · 10 of 123 slices shown, 13 images]
[im 12/123  vessel]
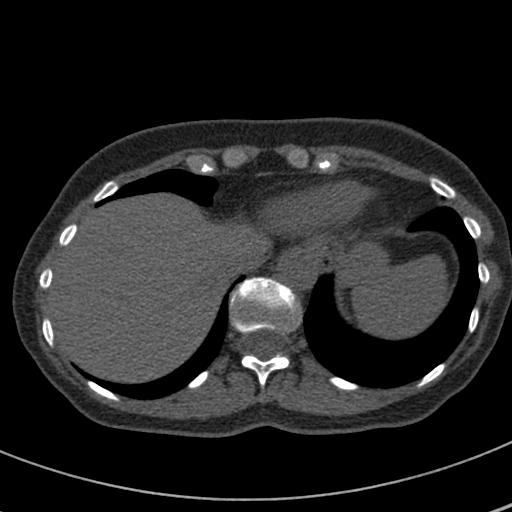
[im 12/123  lung]
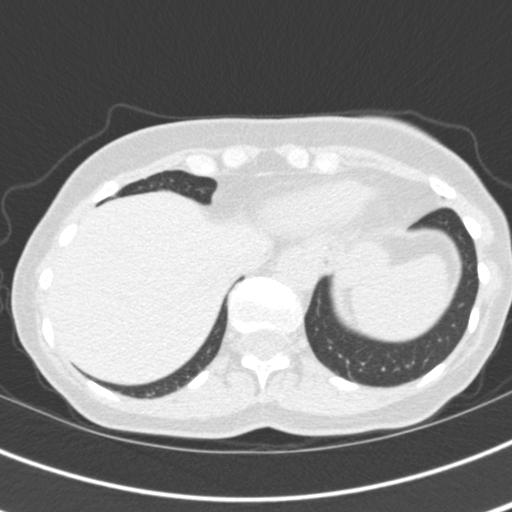
[im 23/123  vessel]
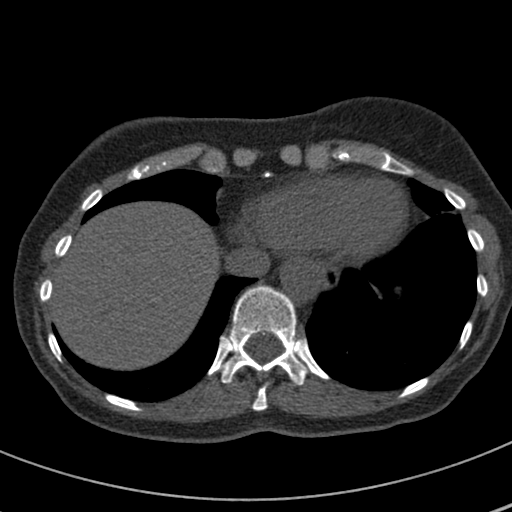
[im 34/123  vessel]
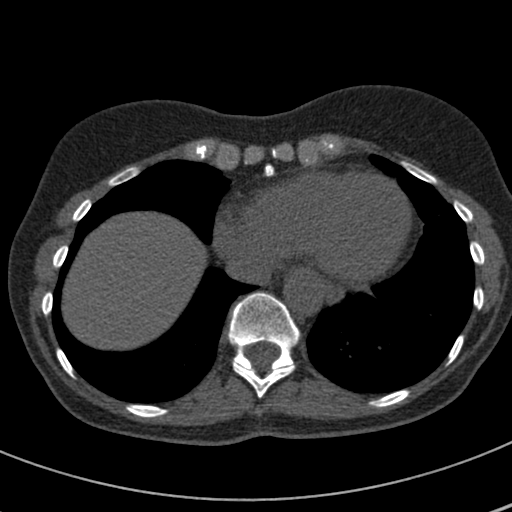
[im 45/123  vessel]
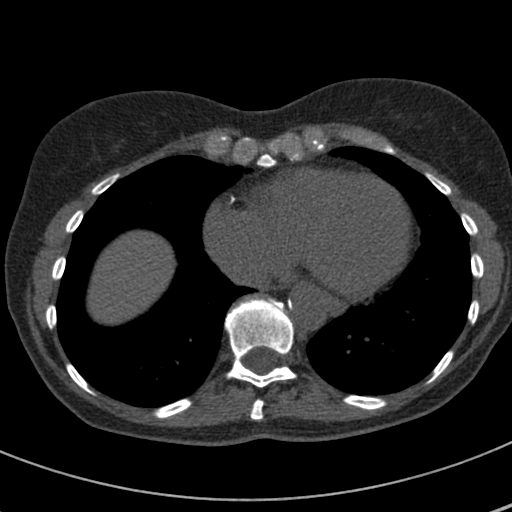
[im 56/123  vessel]
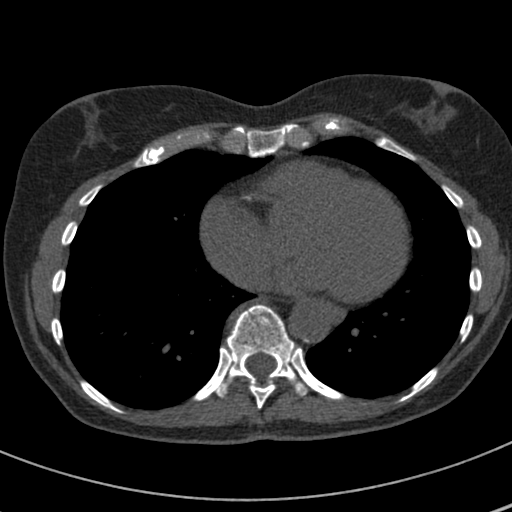
[im 56/123  lung]
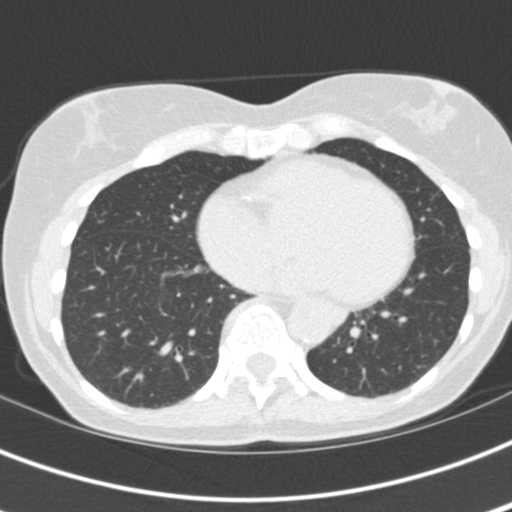
[im 67/123  vessel]
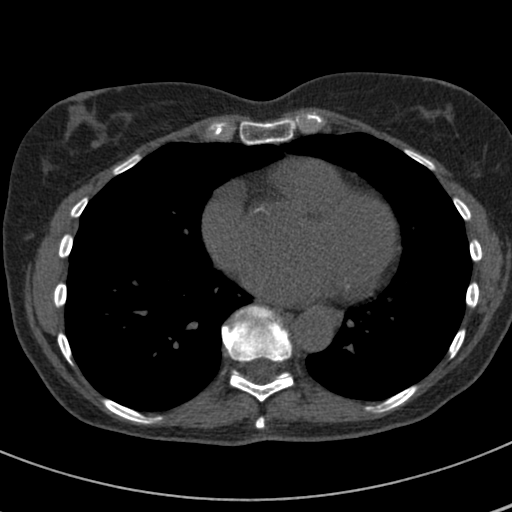
[im 78/123  vessel]
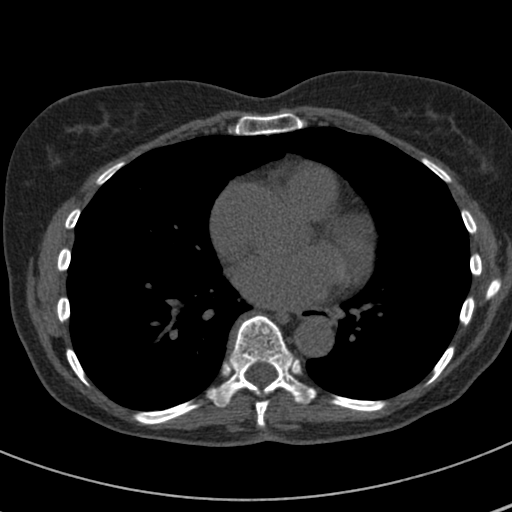
[im 89/123  vessel]
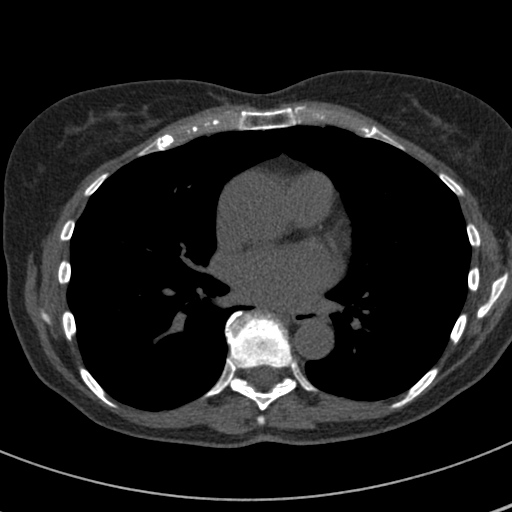
[im 100/123  vessel]
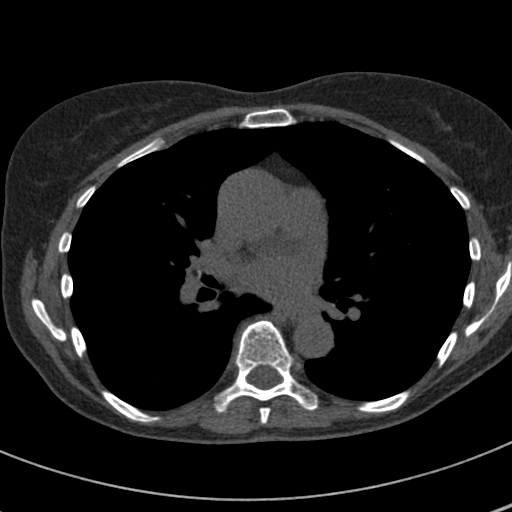
[im 100/123  lung]
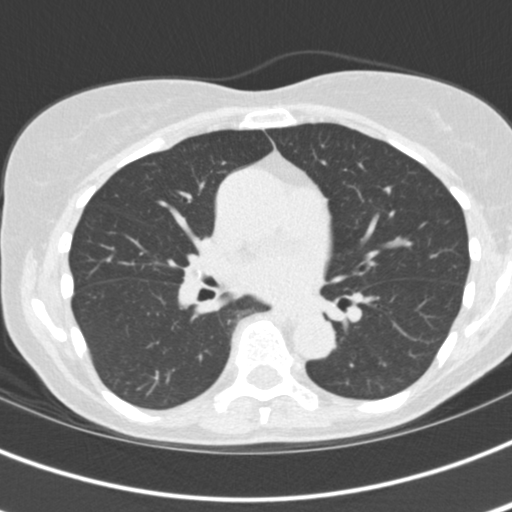
[im 111/123  vessel]
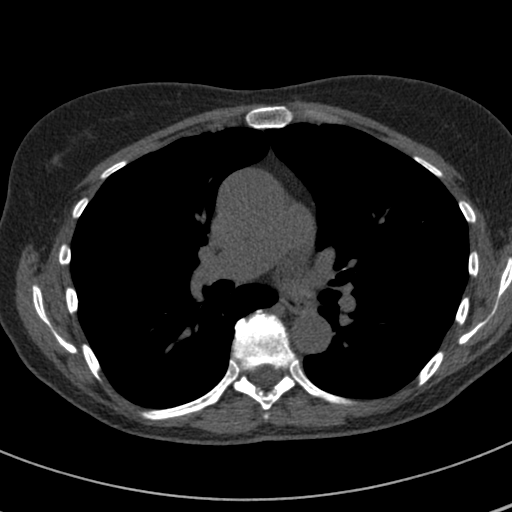

[10 of 20 positions shown; findings below may reference images not displayed]

FINDINGS: Non-cardiac: No significant non cardiac findings on limited lung and
soft tissue windows. See separate report from [REDACTED].

Ascending Aorta:  3.9 cm

Pericardium: Normal

Coronary arteries:  Minimal calcium seen in mid and distal RCA
IMPRESSION: Coronary calcium score of 9. This was 64th percentile for age and
sex matched control.

Pillo Pech

EXAM:
OVER-READ INTERPRETATION  CT CHEST

The following report is an over-read performed by radiologist Dr.
over-read does not include interpretation of cardiac or coronary
anatomy or pathology. The coronary calcium score interpretation by
the cardiologist is attached.
FINDINGS: Limited view of the lung parenchyma is unremarkable. Central airways
are normal. Limited view of the upper abdomen is unremarkable.
Limited view of chest wall and skeleton is unremarkable.
IMPRESSION: No significant extracardiac findings.

## 2016-06-07 DIAGNOSIS — Z23 Encounter for immunization: Secondary | ICD-10-CM | POA: Diagnosis not present

## 2016-06-07 DIAGNOSIS — M810 Age-related osteoporosis without current pathological fracture: Secondary | ICD-10-CM | POA: Diagnosis not present

## 2016-06-07 DIAGNOSIS — M797 Fibromyalgia: Secondary | ICD-10-CM | POA: Diagnosis not present

## 2016-07-17 DIAGNOSIS — H43813 Vitreous degeneration, bilateral: Secondary | ICD-10-CM | POA: Diagnosis not present

## 2016-08-08 DIAGNOSIS — H6122 Impacted cerumen, left ear: Secondary | ICD-10-CM | POA: Diagnosis not present

## 2016-08-08 DIAGNOSIS — J351 Hypertrophy of tonsils: Secondary | ICD-10-CM | POA: Diagnosis not present

## 2016-08-21 DIAGNOSIS — L812 Freckles: Secondary | ICD-10-CM | POA: Diagnosis not present

## 2016-08-21 DIAGNOSIS — D1801 Hemangioma of skin and subcutaneous tissue: Secondary | ICD-10-CM | POA: Diagnosis not present

## 2016-08-21 DIAGNOSIS — L570 Actinic keratosis: Secondary | ICD-10-CM | POA: Diagnosis not present

## 2016-08-21 DIAGNOSIS — Z85828 Personal history of other malignant neoplasm of skin: Secondary | ICD-10-CM | POA: Diagnosis not present

## 2016-08-21 DIAGNOSIS — D485 Neoplasm of uncertain behavior of skin: Secondary | ICD-10-CM | POA: Diagnosis not present

## 2016-08-21 DIAGNOSIS — L821 Other seborrheic keratosis: Secondary | ICD-10-CM | POA: Diagnosis not present

## 2016-09-21 DIAGNOSIS — M25562 Pain in left knee: Secondary | ICD-10-CM | POA: Diagnosis not present

## 2016-09-21 DIAGNOSIS — M1732 Unilateral post-traumatic osteoarthritis, left knee: Secondary | ICD-10-CM | POA: Diagnosis not present

## 2016-09-27 DIAGNOSIS — Z1231 Encounter for screening mammogram for malignant neoplasm of breast: Secondary | ICD-10-CM | POA: Diagnosis not present

## 2016-10-13 DIAGNOSIS — M1732 Unilateral post-traumatic osteoarthritis, left knee: Secondary | ICD-10-CM | POA: Diagnosis not present

## 2016-10-25 ENCOUNTER — Other Ambulatory Visit (HOSPITAL_COMMUNITY)
Admission: RE | Admit: 2016-10-25 | Discharge: 2016-10-25 | Disposition: A | Discharge status: Home / Self Care | Payer: Medicare Other | Source: Ambulatory Visit | Attending: Obstetrics and Gynecology | Admitting: Obstetrics and Gynecology

## 2016-10-25 ENCOUNTER — Encounter: Payer: Self-pay | Admitting: Obstetrics and Gynecology

## 2016-10-25 ENCOUNTER — Ambulatory Visit (INDEPENDENT_AMBULATORY_CARE_PROVIDER_SITE_OTHER): Payer: Medicare Other | Admitting: Obstetrics and Gynecology

## 2016-10-25 VITALS — BP 112/72 | HR 84 | Resp 16 | Ht 61.5 in | Wt 113.0 lb

## 2016-10-25 DIAGNOSIS — Z01419 Encounter for gynecological examination (general) (routine) without abnormal findings: Secondary | ICD-10-CM | POA: Diagnosis not present

## 2016-10-25 DIAGNOSIS — N952 Postmenopausal atrophic vaginitis: Secondary | ICD-10-CM | POA: Diagnosis not present

## 2016-10-25 DIAGNOSIS — Z124 Encounter for screening for malignant neoplasm of cervix: Secondary | ICD-10-CM

## 2016-10-25 DIAGNOSIS — M810 Age-related osteoporosis without current pathological fracture: Secondary | ICD-10-CM

## 2016-10-25 NOTE — Progress Notes (Addendum)
66 y.o. [id number] MarriedCaucasianF here for annual exam.  She notices a small right vulvar bump, not tender. It was bigger, has gotten smaller. No drainage.  She ran out of the vagifem, doesn't want anymore. Not sexually active.  No vaginal bleeding.  H/O Osteoporosis, was on Prolia before, taken off by Rheumatology in 2016. She needs a new primary, her primary left.  She has fibromyalgia, stable.     No LMP recorded. Patient is postmenopausal.          Sexually active: No.  The current method of family planning is post menopausal status.    Exercising: Yes.    stretching and yoga Smoker:  no  Health Maintenance: Pap:  07-24-12 WNL NEG HR HPV History of abnormal Pap:  no MMG:  August 2018 done at [facility] - normal per patient Colonoscopy:  01-08-13 polyps BMD:   11-17-2014 Osteoporosis,  TDaP:  Unsure  Gardasil: N/a   reports that she has never smoked. She has never used smokeless tobacco. She reports that she drinks about 1.0 oz of alcohol per week . She reports that she does not use drugs. Retired. 2 kids, 4 biological grandchildren, 6 step-grandchildren.  Past Medical History:  Diagnosis Date  . Atrophic vaginitis   . Chronic urinary tract infection   . Endometrial polyp   . Fibromyalgia   . GERD (gastroesophageal reflux disease)   . H/O hiatal hernia   . Osteoporosis   . PONV (postoperative nausea and vomiting)   . Spinal stenosis     Past Surgical History:  Procedure Laterality Date  . BACK SURGERY  7/14   lumbar fusion  . BARTHOLIN CYST MARSUPIALIZATION    . BUNIONECTOMY    . DILATION AND CURETTAGE OF UTERUS    . ear lobe surg    . FACIAL COSMETIC SURGERY    . KNEE SURGERY     40 YEARS AGO ( LEFT)  . TUBAL LIGATION      Current Outpatient Prescriptions  Medication Sig Dispense Refill  . acetaminophen (TYLENOL) 500 MG tablet Take 500 mg by mouth every 8 (eight) hours as needed.    . calcium carbonate (OS-CAL) 600 MG TABS Take 600 mg by mouth daily with breakfast.     . carisoprodol (SOMA) 350 MG tablet Take 350 mg by mouth at bedtime as needed.  1  . celecoxib (CELEBREX) 200 MG capsule Take 200 mg by mouth daily.    . cholecalciferol (VITAMIN D) 1000 UNITS tablet Take 1,000 Units by mouth daily.    . Magnesium 250 MG TABS Take 250 mg by mouth daily.    Marland Kitchen tiZANidine (ZANAFLEX) 4 MG capsule Take 4 mg by mouth daily.     No current facility-administered medications for this visit.     Family History  Problem Relation Age of Onset  . CAD Mother 18  . Dementia Mother   . Hypertension Father   . Heart disease Father        Pacemaker  . Other Father        Myothenia Gravis  . Melanoma Father   . Dementia Father   . CAD Maternal Grandfather 61       Died of MI    Review of Systems  Constitutional: Negative.   HENT: Negative.   Eyes: Negative.   Respiratory: Negative.   Cardiovascular: Negative.   Gastrointestinal: Negative.   Endocrine: Negative.   Genitourinary: Negative.   Musculoskeletal: Negative.   Skin: Negative.   Allergic/Immunologic: Negative.  Neurological: Negative.   Hematological: Negative.   Psychiatric/Behavioral: Negative.     Exam:   BP 112/72 (BP Location: Right Arm, Patient Position: Sitting, Cuff Size: Normal)   Pulse 84   Resp 16   Ht 5' 1.5" (1.562 m)   Wt 113 lb (51.3 kg)   BMI 21.01 kg/m   Weight change: @WEIGHTCHANGE @ Height:   Height: 5' 1.5" (156.2 cm)  Ht Readings from Last 3 Encounters:  10/25/16 5' 1.5" (1.562 m)  10/20/15 5' 1.75" (1.568 m)  12/22/14 5\' 2"  (1.575 m)    General appearance: alert, cooperative and appears stated age Head: Normocephalic, without obvious abnormality, atraumatic Neck: no adenopathy, supple, symmetrical, trachea midline and thyroid normal to inspection and palpation Lungs: clear to auscultation bilaterally Cardiovascular: regular rate and rhythm Breasts: normal appearance, no masses or tenderness Abdomen: soft, non-tender; non distended,  no masses,  no  organomegaly Extremities: extremities normal, atraumatic, no cyanosis or edema Skin: Skin color, texture, turgor normal. No rashes or lesions Lymph nodes: Cervical, supraclavicular, and axillary nodes normal. No abnormal inguinal nodes palpated Neurologic: Grossly normal   Pelvic: External genitalia:  no lesions, unable to locate the "bump", patient can't find it either              Urethra:  normal appearing urethra with no masses, tenderness or lesions              Bartholins and Skenes: normal                 Vagina: normal appearing atrophic vagina with normal color and discharge, no lesions              Cervix: no lesions               Bimanual Exam:  Uterus:  normal size, contour, position, consistency, mobility, non-tender              Adnexa: no mass, fullness, tenderness               Rectovaginal: Confirms               Anus:  normal sphincter tone, no lesions  Chaperone was present for exam.  A:  Well Woman with normal exam  H/O osteoporosis, currently off medication  P:   Pap with hpv  Mammogram UTD  DEXA due next month  Get a copy of Dr Trudie Reed notes from 2016 (Rheumatology)  Discussed breast self exam  Discussed calcium and vit D intake   Addendum: Rheumatology note from 12/21/14 reviewed and scanned. A drug holiday was suggested with a f/u DEXA in 2 years. Further evaluation after the next DEXA.

## 2016-10-25 NOTE — Patient Instructions (Signed)

## 2016-10-27 LAB — CYTOLOGY - PAP
Diagnosis: NEGATIVE
HPV: NOT DETECTED

## 2016-11-06 ENCOUNTER — Encounter: Payer: Self-pay | Admitting: Obstetrics and Gynecology

## 2016-11-20 DIAGNOSIS — Z13820 Encounter for screening for osteoporosis: Secondary | ICD-10-CM | POA: Diagnosis not present

## 2016-11-20 DIAGNOSIS — M810 Age-related osteoporosis without current pathological fracture: Secondary | ICD-10-CM | POA: Diagnosis not present

## 2016-11-29 ENCOUNTER — Ambulatory Visit (INDEPENDENT_AMBULATORY_CARE_PROVIDER_SITE_OTHER): Payer: Medicare Other | Admitting: Family Medicine

## 2016-11-29 ENCOUNTER — Encounter: Payer: Self-pay | Admitting: Family Medicine

## 2016-11-29 VITALS — BP 114/72 | HR 78 | Temp 98.4°F | Ht 62.0 in | Wt 114.8 lb

## 2016-11-29 DIAGNOSIS — Z0000 Encounter for general adult medical examination without abnormal findings: Secondary | ICD-10-CM

## 2016-11-29 DIAGNOSIS — M797 Fibromyalgia: Secondary | ICD-10-CM

## 2016-11-29 LAB — LIPID PANEL
Cholesterol: 223 mg/dL — ABNORMAL HIGH (ref 0–200)
HDL: 70.3 mg/dL (ref >39.00)
LDL Cholesterol: 131 mg/dL — ABNORMAL HIGH (ref 0–99)
NonHDL: 152.75
Total CHOL/HDL Ratio: 3
Triglycerides: 110 mg/dL (ref 0.0–149.0)
VLDL: 22 mg/dL (ref 0.0–40.0)

## 2016-11-29 LAB — COMPREHENSIVE METABOLIC PANEL
ALT: 11 U/L (ref 0–35)
AST: 20 U/L (ref 0–37)
Albumin: 4.6 g/dL (ref 3.5–5.2)
Alkaline Phosphatase: 54 U/L (ref 39–117)
BUN: 13 mg/dL (ref 6–23)
CO2: 31 mEq/L (ref 19–32)
Calcium: 9.8 mg/dL (ref 8.4–10.5)
Chloride: 103 mEq/L (ref 96–112)
Creatinine, Ser: 0.74 mg/dL (ref 0.40–1.20)
GFR: 83.3 mL/min (ref >60.00)
Glucose, Bld: 92 mg/dL (ref 70–99)
Potassium: 4.2 mEq/L (ref 3.5–5.1)
Sodium: 142 mEq/L (ref 135–145)
Total Bilirubin: 0.6 mg/dL (ref 0.2–1.2)
Total Protein: 7.1 g/dL (ref 6.0–8.3)

## 2016-11-29 LAB — CBC WITH DIFFERENTIAL/PLATELET
Basophils Absolute: 0 10*3/uL (ref 0.0–0.1)
Basophils Relative: 0.5 % (ref 0.0–3.0)
Eosinophils Absolute: 0.2 10*3/uL (ref 0.0–0.7)
Eosinophils Relative: 2.8 % (ref 0.0–5.0)
HCT: 41.9 % (ref 36.0–46.0)
Hemoglobin: 13.8 g/dL (ref 12.0–15.0)
Lymphocytes Relative: 29.7 % (ref 12.0–46.0)
Lymphs Abs: 1.7 10*3/uL (ref 0.7–4.0)
MCHC: 32.8 g/dL (ref 30.0–36.0)
MCV: 91.2 fl (ref 78.0–100.0)
Monocytes Absolute: 0.6 10*3/uL (ref 0.1–1.0)
Monocytes Relative: 9.7 % (ref 3.0–12.0)
Neutro Abs: 3.3 10*3/uL (ref 1.4–7.7)
Neutrophils Relative %: 57.3 % (ref 43.0–77.0)
Platelets: 165 10*3/uL (ref 150.0–400.0)
RBC: 4.6 Mil/uL (ref 3.87–5.11)
RDW: 13.5 % (ref 11.5–15.5)
WBC: 5.8 10*3/uL (ref 4.0–10.5)

## 2016-11-29 MED ORDER — CARISOPRODOL 350 MG PO TABS: 350.0000 mg | ORAL_TABLET | Freq: Every evening | ORAL | 1 refills | Status: DC | PRN

## 2016-11-29 NOTE — Progress Notes (Addendum)
Deanna [name] is a 66 y.o. female is here to [facility].   Patient Care Team: Briscoe Deutscher, DO as PCP - General (Family Medicine)   History of Present Illness:   Deanna [name], CMA, acting as scribe for Dr. Juleen [name].  HPI:  Patient comes in today to establish care.  States she has fibromyalgia that is under good control with Celebrex and Soma.  She needs a refill on her Soma.  She states she has been tried on other medications in the past and always has adverse reactions to them.  Recently got a flu shot and had a pneumonia vaccine about 6 months ago.  Will request records.  States she had a bone density test about a week ago at [facility].  She has osteoporosis and states she has been on Fosamax and Prolia in the past.  She is not currently on any medication for osteoporosis but anticipates having to restart Prolia pending the results of her bone density test.  Health Maintenance Due  Topic Date Due  . Hepatitis C Screening  1951-02-03  . TETANUS/TDAP  04/27/1969  . PNA vac Low Risk Adult (1 of 2 - PCV13) 04/28/2015   Depression screen PHQ 2/9 11/29/2016  Decreased Interest 0  Down, Depressed, Hopeless 0  PHQ - 2 Score 0   PMHx, SurgHx, SocialHx, Medications, and Allergies were reviewed in the Visit Navigator and updated as appropriate.   Past Medical History:  Diagnosis Date  . Atrophic vaginitis   . Chronic urinary tract infection   . Endometrial polyp   . Fibromyalgia   . GERD (gastroesophageal reflux disease)   . H/O hiatal hernia   . Osteoporosis   . PONV (postoperative nausea and vomiting)   . Spinal stenosis    Past Surgical History:  Procedure Laterality Date  . BARTHOLIN CYST MARSUPIALIZATION    . BUNIONECTOMY    . DILATION AND CURETTAGE OF UTERUS    . EAR LOBE SURGERY    . FACIAL COSMETIC SURGERY    . KNEE SURGERY Left   . LUMBAR FUSION    . TUBAL LIGATION     Family History  Problem Relation Age of Onset  . CAD Mother 17  . Dementia Mother   .  Hypertension Father   . Heart disease Father        Pacemaker  . Other Father        Myasthenia Gravis  . Melanoma Father   . Dementia Father   . CAD Maternal Grandfather 46       Died of MI   Social History  Substance Use Topics  . Smoking status: Never Smoker  . Smokeless tobacco: Never Used  . Alcohol use 1.0 oz/week    2 Standard drinks or equivalent per week   Current Medications and Allergies:   .  acetaminophen (TYLENOL) 500 MG tablet, Take 500 mg by mouth every 8 (eight) hours as needed., Disp: , Rfl:  .  calcium carbonate (OS-CAL) 600 MG TABS, Take 600 mg by mouth daily with breakfast., Disp: , Rfl:  .  celecoxib (CELEBREX) 200 MG capsule, Take 200 mg by mouth daily., Disp: , Rfl:  .  cholecalciferol (VITAMIN D) 1000 UNITS tablet, Take 1,000 Units by mouth daily., Disp: , Rfl:  .  cyanocobalamin 1000 MCG tablet, Take 1,000 mcg by mouth daily., Disp: , Rfl:  .  Magnesium 250 MG TABS, Take 250 mg by mouth daily., Disp: , Rfl:  .  carisoprodol (SOMA) 350 MG  tablet, Take 1 tablet (350 mg total) by mouth at bedtime as needed for muscle spasms., Disp: 90 tablet, Rfl: 1  Allergies  Allergen Reactions  . Ciprofloxacin Nausea Only   Review of Systems:   Pertinent items are noted in the HPI. Otherwise, ROS is negative.  Vitals:   Vitals:   11/29/16 0837  BP: 114/72  Pulse: 78  Temp: 98.4 F (36.9 C)  TempSrc: Oral  SpO2: 96%  Weight: 114 lb 12.8 oz (52.1 kg)  Height: 5\' 2"  (1.575 m)     Body mass index is 21 kg/m.   Physical Exam:   Physical Exam  Constitutional: She appears well-nourished.  HENT:  Head: Normocephalic and atraumatic.  Eyes: Pupils are equal, round, and reactive to light. EOM are normal.  Neck: Normal range of motion. Neck supple.  Cardiovascular: Normal rate, regular rhythm, normal heart sounds and intact distal pulses.   Pulmonary/Chest: Effort normal.  Abdominal: Soft.  Skin: Skin is warm.  Psychiatric: She has a normal mood and affect.  Her behavior is normal.  Nursing note and vitals reviewed.  Assessment and Plan:   Deanna [name] was seen today for establish care.  Diagnoses and all orders for this visit:  Routine physical examination -     CBC with Differential/Platelet -     Comprehensive metabolic panel -     Lipid panel  Fibromyalgia -     carisoprodol (SOMA) 350 MG tablet; Take 1 tablet (350 mg total) by mouth at bedtime as needed for muscle spasms.  Patient Counseling: [x]    Nutrition: Stressed importance of moderation in sodium/caffeine intake, saturated fat and cholesterol, caloric balance, sufficient intake of fresh fruits, vegetables, fiber, calcium, iron, and 1 mg of folate supplement per day (for females capable of pregnancy).  [x]    Stressed the importance of regular exercise.   [x]    Substance Abuse: Discussed cessation/primary prevention of tobacco, alcohol, or other drug use; driving or other dangerous activities under the influence; availability of treatment for abuse.   [x]    Injury prevention: Discussed safety belts, safety helmets, smoke detector, smoking near bedding or upholstery.   [x]    Sexuality: Discussed sexually transmitted diseases, partner selection, use of condoms, avoidance of unintended pregnancy  and contraceptive alternatives.  [x]    Dental health: Discussed importance of regular tooth brushing, flossing, and dental visits.  [x]    Health maintenance and immunizations reviewed. Please refer to Health maintenance section.   . Reviewed expectations re: course of current medical issues. . Discussed self-management of symptoms. . Outlined signs and symptoms indicating need for more acute intervention. . Patient verbalized understanding and all questions were answered. Marland Kitchen Health Maintenance issues including appropriate healthy diet, exercise, and smoking avoidance were discussed with patient. . See orders for this visit as documented in the electronic medical record. . Patient received an After  Visit Summary.  CMA served as Education administrator during this visit. History, Physical, and Plan performed by medical provider. The above documentation has been reviewed and is accurate and complete. Briscoe Deutscher, D.O.  Briscoe Deutscher, DO [place], [place] 11/29/2016  Future Appointments Date Time Provider Department Center  10/29/2017 1:15 PM Salvadore Dom, MD [place] None

## 2016-12-08 ENCOUNTER — Telehealth: Payer: Self-pay | Admitting: Obstetrics and Gynecology

## 2016-12-08 NOTE — Telephone Encounter (Signed)
Spoke with patient. Advised patient of message as seen below from [place]. Patient verbalizes understanding. Will call Dr.Hawkes to set up an appointment. BMD faxed to Dr.Hawkes at [phone].  Routing to provider for final review. Patient agreeable to disposition. Will close encounter.

## 2016-12-08 NOTE — Telephone Encounter (Signed)
Left message to call Margherita Collyer at [phone]. 

## 2016-12-08 NOTE — Telephone Encounter (Signed)
Please let the patient know that her osteoporosis is slightly worse in her arm and right femur, up to a 6% decrease. She should f/u with rheumatology. Please have a copy of the DEXA sent to Dr Trudie Reed and tell the patient to set up a f/u visit.

## 2016-12-18 DIAGNOSIS — M150 Primary generalized (osteo)arthritis: Secondary | ICD-10-CM | POA: Diagnosis not present

## 2016-12-18 DIAGNOSIS — M810 Age-related osteoporosis without current pathological fracture: Secondary | ICD-10-CM | POA: Diagnosis not present

## 2016-12-18 DIAGNOSIS — Z6821 Body mass index (BMI) 21.0-21.9, adult: Secondary | ICD-10-CM | POA: Diagnosis not present

## 2016-12-22 ENCOUNTER — Telehealth: Payer: Self-pay | Admitting: Family Medicine

## 2016-12-22 NOTE — Telephone Encounter (Signed)
Okay to get order started.

## 2016-12-22 NOTE — Telephone Encounter (Signed)
Please advise on starting back on Prolia.

## 2016-12-22 NOTE — Telephone Encounter (Signed)
Deanna [name] can you start the process for the prolia. Thank you

## 2016-12-22 NOTE — Telephone Encounter (Signed)
Patient wants to start back taking Prolia. Please advise.

## 2016-12-26 NOTE — Telephone Encounter (Signed)
Patient submitted for approval to [employer]

## 2017-01-10 ENCOUNTER — Telehealth: Payer: Self-pay

## 2017-01-10 NOTE — Telephone Encounter (Signed)
Called both home and cell number and asked that patient call the office and schedule a nurse visit for her Prolia injection. Medication is here on site and she can schedule at her convenience.

## 2017-01-17 ENCOUNTER — Ambulatory Visit (INDEPENDENT_AMBULATORY_CARE_PROVIDER_SITE_OTHER): Payer: Medicare Other | Admitting: Family Medicine

## 2017-01-17 DIAGNOSIS — M810 Age-related osteoporosis without current pathological fracture: Secondary | ICD-10-CM

## 2017-01-17 MED ORDER — DENOSUMAB 60 MG/ML ~~LOC~~ SOLN
60.0000 mg | Freq: Once | SUBCUTANEOUS | Status: AC
  Administered 2017-01-17: 60 mg via SUBCUTANEOUS

## 2017-01-21 NOTE — Progress Notes (Signed)
Noted and agree. Deanna [name]

## 2017-03-21 DIAGNOSIS — M1712 Unilateral primary osteoarthritis, left knee: Secondary | ICD-10-CM | POA: Diagnosis not present

## 2017-03-21 DIAGNOSIS — M1990 Unspecified osteoarthritis, unspecified site: Secondary | ICD-10-CM | POA: Insufficient documentation

## 2017-04-16 ENCOUNTER — Other Ambulatory Visit: Payer: Self-pay | Admitting: Family Medicine

## 2017-04-17 NOTE — Telephone Encounter (Signed)
Please advise historical provider.

## 2017-06-12 ENCOUNTER — Ambulatory Visit (INDEPENDENT_AMBULATORY_CARE_PROVIDER_SITE_OTHER): Payer: Medicare Other | Admitting: Family Medicine

## 2017-06-12 ENCOUNTER — Encounter: Payer: Self-pay | Admitting: Family Medicine

## 2017-06-12 VITALS — BP 118/62 | HR 74 | Temp 98.5°F | Ht 62.0 in | Wt 115.0 lb

## 2017-06-12 DIAGNOSIS — M797 Fibromyalgia: Secondary | ICD-10-CM

## 2017-06-12 DIAGNOSIS — M4800 Spinal stenosis, site unspecified: Secondary | ICD-10-CM | POA: Diagnosis not present

## 2017-06-12 DIAGNOSIS — Z23 Encounter for immunization: Secondary | ICD-10-CM | POA: Diagnosis not present

## 2017-06-12 MED ORDER — CELECOXIB 200 MG PO CAPS: ORAL_CAPSULE | ORAL | 0 refills | Status: DC

## 2017-06-12 MED ORDER — CARISOPRODOL 350 MG PO TABS: 350.0000 mg | ORAL_TABLET | Freq: Every evening | ORAL | 1 refills | Status: DC | PRN

## 2017-06-12 NOTE — Progress Notes (Signed)
Deanna [name] is a 67 y.o. female is here for follow up.  History of Present Illness:   HPI: Patient presents for follow-up for fibromyalgia and chronic pain.  She is still doing well on the Soma at night.  She takes Celebrex during the day.  No oversedation.  No other concerns today.  Following with orthopedics for left knee pain.  On the verge of the knee replacement.  Health Maintenance Due  Topic Date Due  . Hepatitis C Screening  1950-09-18  . PNA vac Low Risk Adult (2 of 2 - PPSV23) 06/07/2017   Depression screen PHQ 2/9 11/29/2016  Decreased Interest 0  Down, Depressed, Hopeless 0  PHQ - 2 Score 0   PMHx, SurgHx, SocialHx, FamHx, Medications, and Allergies were reviewed in the Visit Navigator and updated as appropriate.   Patient Active Problem List   Diagnosis Date Noted  . Osteoarthritis 03/21/2017  . Endometrial polyp   . Osteoporosis   . Atrophic vaginitis   . Spinal stenosis   . Fibromyalgia    Social History   Tobacco Use  . Smoking status: Never Smoker  . Smokeless tobacco: Never Used  Substance Use Topics  . Alcohol use: Yes    Alcohol/week: 1.0 oz    Types: 2 Standard drinks or equivalent per week  . Drug use: No   Current Medications and Allergies:   Current Outpatient Medications:  .  acetaminophen (TYLENOL) 500 MG tablet, Take 500 mg by mouth every 8 (eight) hours as needed., Disp: , Rfl:  .  calcium carbonate (OS-CAL) 600 MG TABS, Take 600 mg by mouth daily with breakfast., Disp: , Rfl:  .  carisoprodol (SOMA) 350 MG tablet, Take 1 tablet (350 mg total) by mouth at bedtime as needed for muscle spasms., Disp: 90 tablet, Rfl: 1 .  celecoxib (CELEBREX) 200 MG capsule, TAKE ONE CAPSULE BY MOUTH EVERY DAY AS NEEDED FOR PAIN, Disp: 90 capsule, Rfl: 0 .  cholecalciferol (VITAMIN D) 1000 UNITS tablet, Take 1,000 Units by mouth daily., Disp: , Rfl:  .  cyanocobalamin 1000 MCG tablet, Take 1,000 mcg by mouth daily., Disp: , Rfl:  .  Magnesium 250 MG  TABS, Take 250 mg by mouth daily., Disp: , Rfl:    Allergies  Allergen Reactions  . Ciprofloxacin Nausea Only   Review of Systems   Pertinent items are noted in the HPI. Otherwise, ROS is negative.  Vitals:   Vitals:   06/12/17 0940  BP: 118/62  Pulse: 74  Temp: 98.5 F (36.9 C)  TempSrc: Oral  SpO2: 96%  Weight: 115 lb (52.2 kg)  Height: 5\' 2"  (1.575 m)     Body mass index is 21.03 kg/m.   Physical Exam:   Physical Exam  Constitutional: She is oriented to person, place, and time. She appears well-developed and well-nourished. No distress.  HENT:  Head: Normocephalic and atraumatic.  Right Ear: External ear normal.  Left Ear: External ear normal.  Nose: Nose normal.  Mouth/Throat: Oropharynx is clear and moist.  Eyes: Pupils are equal, round, and reactive to light. Conjunctivae and EOM are normal.  Neck: Normal range of motion. Neck supple. No thyromegaly present.  Cardiovascular: Normal rate, regular rhythm, normal heart sounds and intact distal pulses.  Pulmonary/Chest: Effort normal and breath sounds normal.  Abdominal: Soft. Bowel sounds are normal.  Musculoskeletal: Normal range of motion.  Lymphadenopathy:    She has no cervical adenopathy.  Neurological: She is alert and oriented to person, place, and  time.  Skin: Skin is warm and dry. Capillary refill takes less than 2 seconds.  Psychiatric: She has a normal mood and affect. Her behavior is normal.  Nursing note and vitals reviewed.   Assessment and Plan:   Deanna [name] was seen today for medication refill.  Diagnoses and all orders for this visit:  Fibromyalgia -     celecoxib (CELEBREX) 200 MG capsule; TAKE ONE CAPSULE BY MOUTH EVERY DAY AS NEEDED FOR PAIN -     carisoprodol (SOMA) 350 MG tablet; Take 1 tablet (350 mg total) by mouth at bedtime as needed for muscle spasms.  Spinal stenosis, unspecified spinal region -     celecoxib (CELEBREX) 200 MG capsule; TAKE ONE CAPSULE BY MOUTH EVERY DAY AS NEEDED  FOR PAIN -     carisoprodol (SOMA) 350 MG tablet; Take 1 tablet (350 mg total) by mouth at bedtime as needed for muscle spasms.  Need for pneumococcal vaccination -     Pneumococcal polysaccharide vaccine 23-valent greater than or equal to 2yo subcutaneous/IM   . Reviewed expectations re: course of current medical issues. . Discussed self-management of symptoms. . Outlined signs and symptoms indicating need for more acute intervention. . Patient verbalized understanding and all questions were answered. Marland Kitchen Health Maintenance issues including appropriate healthy diet, exercise, and smoking avoidance were discussed with patient. . See orders for this visit as documented in the electronic medical record. . Patient received an After Visit Summary.  Briscoe Deutscher, DO [place], [place] 06/12/2017  Future Appointments  Date Time Provider [place]  10/29/2017  1:15 PM Salvadore Dom, MD [place] None

## 2017-06-13 DIAGNOSIS — H3540 Unspecified peripheral retinal degeneration: Secondary | ICD-10-CM | POA: Diagnosis not present

## 2017-06-13 DIAGNOSIS — H43393 Other vitreous opacities, bilateral: Secondary | ICD-10-CM | POA: Diagnosis not present

## 2017-06-13 DIAGNOSIS — H43813 Vitreous degeneration, bilateral: Secondary | ICD-10-CM | POA: Diagnosis not present

## 2017-07-04 ENCOUNTER — Telehealth: Payer: Self-pay

## 2017-07-04 NOTE — Telephone Encounter (Signed)
Called patient and left a voicemail message asking patient to call and schedule a Prolia injection (nurse visit) after July 18, 2017. Patient's out of pocket cost should be $0. Medication is sitting in fridge.

## 2017-07-04 NOTE — Telephone Encounter (Signed)
Noted  

## 2017-07-20 ENCOUNTER — Ambulatory Visit (INDEPENDENT_AMBULATORY_CARE_PROVIDER_SITE_OTHER): Payer: Medicare Other

## 2017-07-20 DIAGNOSIS — M810 Age-related osteoporosis without current pathological fracture: Secondary | ICD-10-CM

## 2017-07-20 MED ORDER — DENOSUMAB 60 MG/ML ~~LOC~~ SOSY
60.0000 mg | PREFILLED_SYRINGE | Freq: Once | SUBCUTANEOUS | Status: AC
Start: 2017-07-20 — End: 2017-07-20
  Administered 2017-07-20: 60 mg via SUBCUTANEOUS

## 2017-07-20 NOTE — Progress Notes (Signed)
Per orders of Dr.Wallace, injection of Prolia  given by Francella Solian. Patient tolerated injection well. Injection givne in left SQ. She did not have any questions or problems.

## 2017-08-24 DIAGNOSIS — D225 Melanocytic nevi of trunk: Secondary | ICD-10-CM | POA: Diagnosis not present

## 2017-08-24 DIAGNOSIS — D1801 Hemangioma of skin and subcutaneous tissue: Secondary | ICD-10-CM | POA: Diagnosis not present

## 2017-08-24 DIAGNOSIS — Z85828 Personal history of other malignant neoplasm of skin: Secondary | ICD-10-CM | POA: Diagnosis not present

## 2017-08-24 DIAGNOSIS — L821 Other seborrheic keratosis: Secondary | ICD-10-CM | POA: Diagnosis not present

## 2017-08-29 DIAGNOSIS — M546 Pain in thoracic spine: Secondary | ICD-10-CM | POA: Diagnosis not present

## 2017-08-29 DIAGNOSIS — M5412 Radiculopathy, cervical region: Secondary | ICD-10-CM | POA: Diagnosis not present

## 2017-08-30 DIAGNOSIS — M5412 Radiculopathy, cervical region: Secondary | ICD-10-CM | POA: Insufficient documentation

## 2017-08-30 DIAGNOSIS — M546 Pain in thoracic spine: Secondary | ICD-10-CM | POA: Insufficient documentation

## 2017-10-03 DIAGNOSIS — Z1231 Encounter for screening mammogram for malignant neoplasm of breast: Secondary | ICD-10-CM | POA: Diagnosis not present

## 2017-10-06 ENCOUNTER — Other Ambulatory Visit: Payer: Self-pay | Admitting: Family Medicine

## 2017-10-06 DIAGNOSIS — M797 Fibromyalgia: Secondary | ICD-10-CM

## 2017-10-06 DIAGNOSIS — M4800 Spinal stenosis, site unspecified: Secondary | ICD-10-CM

## 2017-10-23 ENCOUNTER — Encounter: Payer: Self-pay | Admitting: Obstetrics and Gynecology

## 2017-10-29 ENCOUNTER — Other Ambulatory Visit: Payer: Self-pay

## 2017-10-29 ENCOUNTER — Ambulatory Visit (INDEPENDENT_AMBULATORY_CARE_PROVIDER_SITE_OTHER): Payer: Medicare Other | Admitting: Obstetrics and Gynecology

## 2017-10-29 ENCOUNTER — Encounter: Payer: Self-pay | Admitting: Obstetrics and Gynecology

## 2017-10-29 VITALS — BP 124/68 | HR 76 | Ht 61.5 in | Wt 115.0 lb

## 2017-10-29 DIAGNOSIS — Z01419 Encounter for gynecological examination (general) (routine) without abnormal findings: Secondary | ICD-10-CM

## 2017-10-29 DIAGNOSIS — M810 Age-related osteoporosis without current pathological fracture: Secondary | ICD-10-CM | POA: Diagnosis not present

## 2017-10-29 NOTE — Progress Notes (Signed)
67 y.o. G2P2002 Married White or Caucasian Not Hispanic or Latino female here for annual exam.  No vaginal bleeding. Not sexually active.     She was restarted on Prolia for Osteoporosis by the Rheumatologist this year. Getting the Prolia through Dr Juleen China. Getting calcium and vit d. Some exercise.   No LMP recorded. Patient is postmenopausal.          Sexually active: No.  The current method of family planning is post menopausal status.    Exercising: Yes.    walking and yoga Smoker:  no  Health Maintenance: Pap:  10/2016 normal, negative HPV History of abnormal Pap:  no MMG:  09/2017 [place] negative 1 BMD:   11/2016, osteoporosis (-2.6 right femur, -2.9 left femur). She was on Prolia in the past, taken off by Rheumatology in 2016. Restarted Prolia in Spring 2019. Colonoscopy: 2014 repeat in 12/19 TDaP:  2013 Gardasil: never   reports that she has never smoked. She has never used smokeless tobacco. She reports that she drinks about 2.0 standard drinks of alcohol per week. She reports that she does not use drugs. Retired. 2 kids, 4 biological grandchildren, 6 step-grandchildren.  Past Medical History:  Diagnosis Date  . Atrophic vaginitis   . Chronic urinary tract infection   . Endometrial polyp   . Fibromyalgia   . GERD (gastroesophageal reflux disease)   . H/O hiatal hernia   . Osteoporosis   . PONV (postoperative nausea and vomiting)   . Spinal stenosis     Past Surgical History:  Procedure Laterality Date  . BARTHOLIN CYST MARSUPIALIZATION    . BUNIONECTOMY    . DILATION AND CURETTAGE OF UTERUS    . EAR LOBE SURGERY    . FACIAL COSMETIC SURGERY    . KNEE SURGERY Left   . LUMBAR FUSION    . TUBAL LIGATION      Current Outpatient Medications  Medication Sig Dispense Refill  . acetaminophen (TYLENOL) 500 MG tablet Take 500 mg by mouth every 8 (eight) hours as needed.    . calcium carbonate (OS-CAL) 600 MG TABS Take 600 mg by mouth daily with breakfast.    .  carisoprodol (SOMA) 350 MG tablet Take 1 tablet (350 mg total) by mouth at bedtime as needed for muscle spasms. 90 tablet 1  . celecoxib (CELEBREX) 200 MG capsule TAKE ONE CAPSULE BY MOUTH EVERY DAY AS NEEDED FOR PAIN 90 capsule 0  . cholecalciferol (VITAMIN D) 1000 UNITS tablet Take 1,000 Units by mouth daily.    . cyanocobalamin 1000 MCG tablet Take 1,000 mcg by mouth daily.    Marland Kitchen denosumab (PROLIA) 60 MG/ML SOSY injection Inject 60 mg into the skin every 6 (six) months.    . Magnesium 250 MG TABS Take 250 mg by mouth daily.     No current facility-administered medications for this visit.     Family History  Problem Relation Age of Onset  . CAD Mother 25  . Dementia Mother   . Hypertension Father   . Heart disease Father        Pacemaker  . Other Father        Myasthenia Gravis  . Melanoma Father   . Dementia Father   . CAD Maternal Grandfather 12       Died of MI    Review of Systems  Constitutional: Negative.   HENT: Negative.   Eyes: Negative.   Respiratory: Negative.   Cardiovascular: Negative.   Gastrointestinal: Negative.   Endocrine:  Negative.   Genitourinary: Negative.   Musculoskeletal: Negative.   Skin: Negative.   Allergic/Immunologic: Negative.   Neurological: Negative.   Hematological: Negative.   Psychiatric/Behavioral: Negative.   All other systems reviewed and are negative.   Exam:   BP 124/68   Pulse 76   Ht 5' 1.5" (1.562 m)   Wt 115 lb (52.2 kg)   BMI 21.38 kg/m   Weight change: @WEIGHTCHANGE @ Height:   Height: 5' 1.5" (156.2 cm)  Ht Readings from Last 3 Encounters:  10/29/17 5' 1.5" (1.562 m)  06/12/17 5\' 2"  (1.575 m)  11/29/16 5\' 2"  (1.575 m)    General appearance: alert, cooperative and appears stated age Head: Normocephalic, without obvious abnormality, atraumatic Neck: no adenopathy, supple, symmetrical, trachea midline and thyroid normal to inspection and palpation Lungs: clear to auscultation bilaterally Cardiovascular: regular  rate and rhythm Breasts: normal appearance, no masses or tenderness Abdomen: soft, non-tender; non distended,  no masses,  no organomegaly Extremities: extremities normal, atraumatic, no cyanosis or edema Skin: Skin color, texture, turgor normal. No rashes or lesions Lymph nodes: Cervical, supraclavicular, and axillary nodes normal. No abnormal inguinal nodes palpated Neurologic: Grossly normal   Pelvic: External genitalia:  no lesions              Urethra:  normal appearing urethra with no masses, tenderness or lesions              Bartholins and Skenes: normal                 Vagina: atrophic appearing vagina with normal color and discharge, no lesions              Cervix: no lesions               Bimanual Exam:  Uterus:  normal size, contour, position, consistency, mobility, non-tender              Adnexa: no mass, fullness, tenderness               Rectovaginal: Confirms               Anus:  normal sphincter tone, no lesions  Chaperone was present for exam.  A:  Well Woman with normal exam  Osteoporosis  P:   No pap this year  Mammogram is UTD  Discussed breast self exam  Discussed calcium and vit D intake  Colonoscopy due in 12/19  DEXA due next year, on Prolia with Dr Juleen China

## 2017-10-29 NOTE — Patient Instructions (Signed)
EXERCISE AND DIET:  We recommended that you start or continue a regular exercise program for good health. Regular exercise means any activity that makes your heart beat faster and makes you sweat.  We recommend exercising at least 30 minutes per day at least 3 days a week, preferably 4 or 5.  We also recommend a diet low in fat and sugar.  Inactivity, poor dietary choices and obesity can cause diabetes, heart attack, stroke, and kidney damage, among others.    ALCOHOL AND SMOKING:  Women should limit their alcohol intake to no more than 7 drinks/beers/glasses of wine (combined, not each!) per week. Moderation of alcohol intake to this level decreases your risk of breast cancer and liver damage. And of course, no recreational drugs are part of a healthy lifestyle.  And absolutely no smoking or even second hand smoke. Most people know smoking can cause heart and lung diseases, but did you know it also contributes to weakening of your bones? Aging of your skin?  Yellowing of your teeth and nails?  CALCIUM AND VITAMIN D:  Adequate intake of calcium and Vitamin D are recommended.  The recommendations for exact amounts of these supplements seem to change often, but generally speaking 1,200 mg of calcium (diet or supplement) and 800 units of Vitamin D per day seems prudent. Certain women may benefit from higher intake of Vitamin D.  If you are among these women, your doctor will have told you during your visit.    PAP SMEARS:  Pap smears, to check for cervical cancer or precancers,  have traditionally been done yearly, although recent scientific advances have shown that most women can have pap smears less often.  However, every woman still should have a physical exam from her gynecologist every year. It will include a breast check, inspection of the vulva and vagina to check for abnormal growths or skin changes, a visual exam of the cervix, and then an exam to evaluate the size and shape of the uterus and ovaries.   And after 67 years of age, a rectal exam is indicated to check for rectal cancers. We will also provide age appropriate advice regarding health maintenance, like when you should have certain vaccines, screening for sexually transmitted diseases, bone density testing, colonoscopy, mammograms, etc.   MAMMOGRAMS:  All women over 34 years old should have a yearly mammogram. Many facilities now offer a "3D" mammogram, which may cost around $50 extra out of pocket. If possible,  we recommend you accept the option to have the 3D mammogram performed.  It both reduces the number of women who will be called back for extra views which then turn out to be normal, and it is better than the routine mammogram at detecting truly abnormal areas.    COLONOSCOPY:  Colonoscopy to screen for colon cancer is recommended for all women at age 63.  We know, you hate the idea of the prep.  We agree, BUT, having colon cancer and not knowing it is worse!!  Colon cancer so often starts as a polyp that can be seen and removed at colonscopy, which can quite literally save your life!  And if your first colonoscopy is normal and you have no family history of colon cancer, most women don't have to have it again for 10 years.  Once every ten years, you can do something that may end up saving your life, right?  We will be happy to help you get it scheduled when you are ready.  Be sure to check your insurance coverage so you understand how much it will cost.  It may be covered as a preventative service at no cost, but you should check your particular policy.      Breast Self-Awareness Breast self-awareness means being familiar with how your breasts look and feel. It involves checking your breasts regularly and reporting any changes to your health care provider. Practicing breast self-awareness is important. A change in your breasts can be a sign of a serious medical problem. Being familiar with how your breasts look and feel allows you to  find any problems early, when treatment is more likely to be successful. All women should practice breast self-awareness, including women who have had breast implants. How to do a breast self-exam One way to learn what is normal for your breasts and whether your breasts are changing is to do a breast self-exam. To do a breast self-exam: Look for Changes  1. Remove all the clothing above your waist. 2. Stand in front of a mirror in a room with good lighting. 3. Put your hands on your hips. 4. Push your hands firmly downward. 5. Compare your breasts in the mirror. Look for differences between them (asymmetry), such as: ? Differences in shape. ? Differences in size. ? Puckers, dips, and bumps in one breast and not the other. 6. Look at each breast for changes in your skin, such as: ? Redness. ? Scaly areas. 7. Look for changes in your nipples, such as: ? Discharge. ? Bleeding. ? Dimpling. ? Redness. ? A change in position. Feel for Changes  Carefully feel your breasts for lumps and changes. It is best to do this while lying on your back on the floor and again while sitting or standing in the shower or tub with soapy water on your skin. Feel each breast in the following way:  Place the arm on the side of the breast you are examining above your head.  Feel your breast with the other hand.  Start in the nipple area and make  inch (2 cm) overlapping circles to feel your breast. Use the pads of your three middle fingers to do this. Apply light pressure, then medium pressure, then firm pressure. The light pressure will allow you to feel the tissue closest to the skin. The medium pressure will allow you to feel the tissue that is a little deeper. The firm pressure will allow you to feel the tissue close to the ribs.  Continue the overlapping circles, moving downward over the breast until you feel your ribs below your breast.  Move one finger-width toward the center of the body. Continue to  use the  inch (2 cm) overlapping circles to feel your breast as you move slowly up toward your collarbone.  Continue the up and down exam using all three pressures until you reach your armpit.  Write Down What You Find  Write down what is normal for each breast and any changes that you find. Keep a written record with breast changes or normal findings for each breast. By writing this information down, you do not need to depend only on memory for size, tenderness, or location. Write down where you are in your menstrual cycle, if you are still menstruating. If you are having trouble noticing differences in your breasts, do not get discouraged. With time you will become more familiar with the variations in your breasts and more comfortable with the exam. How often should I examine my breasts? Examine  your breasts every month. If you are breastfeeding, the best time to examine your breasts is after a feeding or after using a breast pump. If you menstruate, the best time to examine your breasts is 5-7 days after your period is over. During your period, your breasts are lumpier, and it may be more difficult to notice changes. When should I see my health care provider? See your health care provider if you notice:  A change in shape or size of your breasts or nipples.  A change in the skin of your breast or nipples, such as a reddened or scaly area.  Unusual discharge from your nipples.  A lump or thick area that was not there before.  Pain in your breasts.  Anything that concerns you.  This information is not intended to replace advice given to you by your health care provider. Make sure you discuss any questions you have with your health care provider. Document Released: 01/23/2005 Document Revised: 07/01/2015 Document Reviewed: 12/13/2014 Elsevier Interactive Patient Education  2018 [employer]  

## 2017-12-12 ENCOUNTER — Ambulatory Visit (INDEPENDENT_AMBULATORY_CARE_PROVIDER_SITE_OTHER): Payer: Medicare Other | Admitting: Family Medicine

## 2017-12-12 ENCOUNTER — Encounter: Payer: Self-pay | Admitting: Family Medicine

## 2017-12-12 VITALS — BP 122/74 | HR 69 | Temp 99.2°F | Ht 61.5 in | Wt 116.0 lb

## 2017-12-12 DIAGNOSIS — M4800 Spinal stenosis, site unspecified: Secondary | ICD-10-CM | POA: Diagnosis not present

## 2017-12-12 DIAGNOSIS — M797 Fibromyalgia: Secondary | ICD-10-CM

## 2017-12-12 DIAGNOSIS — E559 Vitamin D deficiency, unspecified: Secondary | ICD-10-CM

## 2017-12-12 DIAGNOSIS — M546 Pain in thoracic spine: Secondary | ICD-10-CM | POA: Diagnosis not present

## 2017-12-12 DIAGNOSIS — M816 Localized osteoporosis [Lequesne]: Secondary | ICD-10-CM

## 2017-12-12 DIAGNOSIS — E7800 Pure hypercholesterolemia, unspecified: Secondary | ICD-10-CM

## 2017-12-12 DIAGNOSIS — M5412 Radiculopathy, cervical region: Secondary | ICD-10-CM

## 2017-12-12 LAB — COMPREHENSIVE METABOLIC PANEL
ALT: 12 U/L (ref 0–35)
AST: 18 U/L (ref 0–37)
Albumin: 4.7 g/dL (ref 3.5–5.2)
Alkaline Phosphatase: 41 U/L (ref 39–117)
BUN: 17 mg/dL (ref 6–23)
CO2: 30 mEq/L (ref 19–32)
Calcium: 10.1 mg/dL (ref 8.4–10.5)
Chloride: 103 mEq/L (ref 96–112)
Creatinine, Ser: 0.73 mg/dL (ref 0.40–1.20)
GFR: 84.36 mL/min (ref >60.00)
Glucose, Bld: 75 mg/dL (ref 70–99)
Potassium: 5.2 mEq/L — ABNORMAL HIGH (ref 3.5–5.1)
Sodium: 140 mEq/L (ref 135–145)
Total Bilirubin: 0.5 mg/dL (ref 0.2–1.2)
Total Protein: 6.8 g/dL (ref 6.0–8.3)

## 2017-12-12 LAB — LIPID PANEL
Cholesterol: 217 mg/dL — ABNORMAL HIGH (ref 0–200)
HDL: 71.5 mg/dL (ref >39.00)
LDL Cholesterol: 129 mg/dL — ABNORMAL HIGH (ref 0–99)
NonHDL: 145.71
Total CHOL/HDL Ratio: 3
Triglycerides: 84 mg/dL (ref 0.0–149.0)
VLDL: 16.8 mg/dL (ref 0.0–40.0)

## 2017-12-12 LAB — VITAMIN D 25 HYDROXY (VIT D DEFICIENCY, FRACTURES): VITD: 42.42 ng/mL (ref 30.00–100.00)

## 2017-12-12 MED ORDER — CELECOXIB 200 MG PO CAPS: 200.0000 mg | ORAL_CAPSULE | Freq: Every day | ORAL | 1 refills | Status: DC

## 2017-12-12 MED ORDER — CARISOPRODOL 350 MG PO TABS: 350.0000 mg | ORAL_TABLET | Freq: Every evening | ORAL | 1 refills | Status: DC | PRN

## 2017-12-12 NOTE — Progress Notes (Signed)
Deanna [name] is a 67 y.o. female is here for follow up.  History of Present Illness:   HPI: See Assessment and Plan section for Problem Based Charting of issues discussed today.   Health Maintenance Due  Topic Date Due  . Hepatitis C Screening  1950-12-14   Depression screen [facility] 2/9 12/12/2017 11/29/2016  Decreased Interest 0 0  Down, Depressed, Hopeless 0 0  PHQ - 2 Score 0 0   PMHx, SurgHx, SocialHx, FamHx, Medications, and Allergies were reviewed in the Visit Navigator and updated as appropriate.   Patient Active Problem List   Diagnosis Date Noted  . Pain in thoracic spine 08/30/2017  . Cervical radiculitis 08/30/2017  . Osteoarthritis 03/21/2017  . Endometrial polyp   . Osteoporosis   . Atrophic vaginitis   . Spinal stenosis   . Fibromyalgia    Social History   Tobacco Use  . Smoking status: Never Smoker  . Smokeless tobacco: Never Used  Substance Use Topics  . Alcohol use: Yes    Alcohol/week: 2.0 standard drinks    Types: 2 Standard drinks or equivalent per week  . Drug use: No   Current Medications and Allergies:   Current Outpatient Medications:  .  acetaminophen (TYLENOL) 500 MG tablet, Take 500 mg by mouth every 8 (eight) hours as needed., Disp: , Rfl:  .  calcium carbonate (OS-CAL) 600 MG TABS, Take 600 mg by mouth daily with breakfast., Disp: , Rfl:  .  carisoprodol (SOMA) 350 MG tablet, Take 1 tablet (350 mg total) by mouth at bedtime as needed for muscle spasms., Disp: 90 tablet, Rfl: 1 .  celecoxib (CELEBREX) 200 MG capsule, Take 1 capsule (200 mg total) by mouth daily., Disp: 90 capsule, Rfl: 1 .  cholecalciferol (VITAMIN D) 1000 UNITS tablet, Take 1,000 Units by mouth daily., Disp: , Rfl:  .  cyanocobalamin 1000 MCG tablet, Take 1,000 mcg by mouth daily., Disp: , Rfl:  .  denosumab (PROLIA) 60 MG/ML SOSY injection, Inject 60 mg into the skin every 6 (six) months., Disp: , Rfl:  .  Magnesium 250 MG TABS, Take 250 mg by mouth daily., Disp: , Rfl:      Allergies  Allergen Reactions  . Ciprofloxacin Nausea Only   Review of Systems   Pertinent items are noted in the HPI. Otherwise, ROS is negative.  Vitals:   Vitals:   12/12/17 0912  BP: 122/74  Pulse: 69  Temp: 99.2 F (37.3 C)  TempSrc: Oral  SpO2: 95%  Weight: 116 lb (52.6 kg)  Height: 5' 1.5" (1.562 m)     Body mass index is 21.56 kg/m.  Physical Exam:   Physical Exam  Constitutional: She is oriented to person, place, and time. She appears well-developed and well-nourished. No distress.  HENT:  Head: Normocephalic and atraumatic.  Right Ear: External ear normal.  Left Ear: External ear normal.  Nose: Nose normal.  Mouth/Throat: Oropharynx is clear and moist.  Eyes: Pupils are equal, round, and reactive to light. Conjunctivae and EOM are normal.  Neck: Normal range of motion. Neck supple. No thyromegaly present.  Cardiovascular: Normal rate, regular rhythm, normal heart sounds and intact distal pulses.  Pulmonary/Chest: Effort normal and breath sounds normal.  Abdominal: Soft. Bowel sounds are normal.  Musculoskeletal: Normal range of motion.  Lymphadenopathy:    She has no cervical adenopathy.  Neurological: She is alert and oriented to person, place, and time.  Skin: Skin is warm and dry. Capillary refill takes less than  2 seconds.  Psychiatric: She has a normal mood and affect. Her behavior is normal.  Nursing note and vitals reviewed.  Assessment and Plan:   Fibromyalgia She is still doing well on the Soma at night.  She takes Celebrex during the day.  No oversedation.  No other concerns today.  Osteoporosis Most current DEXA scan T-score is minus 2.9.  Plan: Labs per orders. Is pharmacological therapy indicated? Yes. Which medication is selected: Prolia. Administer 60 mg every 6 months as a subcutaneous injection in the upper arm, upper thigh, or abdomen. Instruct patients to take calcium 1000 mg daily and at least 400 IU vitamin D daily. Monitor  for hypocalcemia.   Cervical radiculitis Followed by [facility].  Orders Placed This Encounter  Procedures  . Comprehensive metabolic panel  . Lipid panel  . VITAMIN D 25 Hydroxy (Vit-D Deficiency, Fractures)   Meds ordered this encounter  Medications  . carisoprodol (SOMA) 350 MG tablet    Sig: Take 1 tablet (350 mg total) by mouth at bedtime as needed for muscle spasms.    Dispense:  90 tablet    Refill:  1  . celecoxib (CELEBREX) 200 MG capsule    Sig: Take 1 capsule (200 mg total) by mouth daily.    Dispense:  90 capsule    Refill:  1   . Reviewed expectations re: course of current medical issues. . Discussed self-management of symptoms. . Outlined signs and symptoms indicating need for more acute intervention. . Patient verbalized understanding and all questions were answered. Marland Kitchen Health Maintenance issues including appropriate healthy diet, exercise, and smoking avoidance were discussed with patient. . See orders for this visit as documented in the electronic medical record. . Patient received an After Visit Summary.  Briscoe Deutscher, DO [place] 12/13/2017

## 2017-12-13 NOTE — Assessment & Plan Note (Signed)
Followed by [facility].

## 2017-12-13 NOTE — Assessment & Plan Note (Signed)
She is still doing well on the Soma at night.  She takes Celebrex during the day.  No oversedation.  No other concerns today.

## 2017-12-13 NOTE — Assessment & Plan Note (Signed)
Most current DEXA scan T-score is minus 2.9.  Plan: Labs per orders. Is pharmacological therapy indicated? Yes. Which medication is selected: Prolia. Administer 60 mg every 6 months as a subcutaneous injection in the upper arm, upper thigh, or abdomen. Instruct patients to take calcium 1000 mg daily and at least 400 IU vitamin D daily. Monitor for hypocalcemia.

## 2018-01-10 ENCOUNTER — Telehealth: Payer: Self-pay

## 2018-01-10 NOTE — Telephone Encounter (Signed)
Called and left a voicemail message asking patient to return my call as she needs to be scheduled for a Prolia injection the week of 01/21/18.

## 2018-01-22 ENCOUNTER — Ambulatory Visit (INDEPENDENT_AMBULATORY_CARE_PROVIDER_SITE_OTHER): Payer: Medicare Other

## 2018-01-22 DIAGNOSIS — M816 Localized osteoporosis [Lequesne]: Secondary | ICD-10-CM

## 2018-01-22 MED ORDER — DENOSUMAB 60 MG/ML ~~LOC~~ SOSY
60.0000 mg | PREFILLED_SYRINGE | Freq: Once | SUBCUTANEOUS | Status: AC
  Administered 2018-01-22: 60 mg via SUBCUTANEOUS

## 2018-01-22 NOTE — Patient Instructions (Signed)
Health Maintenance Due  Topic Date Due  . Hepatitis C Screening  [birth year]    Depression screen [facility] 2/9 12/12/2017 11/29/2016  Decreased Interest 0 0  Down, Depressed, Hopeless 0 0  PHQ - 2 Score 0 0

## 2018-01-22 NOTE — Progress Notes (Signed)
Patient here today for Prolia injection. Administered in her left arm. Patient tolerated well.

## 2018-04-23 DIAGNOSIS — K5730 Diverticulosis of large intestine without perforation or abscess without bleeding: Secondary | ICD-10-CM | POA: Diagnosis not present

## 2018-04-23 DIAGNOSIS — K219 Gastro-esophageal reflux disease without esophagitis: Secondary | ICD-10-CM | POA: Diagnosis not present

## 2018-04-23 DIAGNOSIS — Z86010 Personal history of colonic polyps: Secondary | ICD-10-CM | POA: Diagnosis not present

## 2018-04-23 DIAGNOSIS — Z1211 Encounter for screening for malignant neoplasm of colon: Secondary | ICD-10-CM | POA: Diagnosis not present

## 2018-04-23 LAB — CBC AND DIFFERENTIAL
HEMATOCRIT: 41 (ref 36–46)
HEMOGLOBIN: 14 (ref 12.0–16.0)
Platelets: 128 — AB (ref 150–399)
WBC: 7

## 2018-05-07 ENCOUNTER — Encounter: Payer: Self-pay | Admitting: Family Medicine

## 2018-06-11 NOTE — Progress Notes (Signed)
Virtual Visit via Video   Due to the COVID-19 pandemic, this visit was completed with telemedicine (audio/video) technology to reduce patient and provider exposure as well as to preserve personal protective equipment.   I connected with Deanna [name] by a video enabled telemedicine application and verified that I am speaking with the correct person using two identifiers. Location patient: Home Location provider: [facility], Office Persons participating in the virtual visit: Deanna [name], [place], DO   I discussed the limitations of evaluation and management by telemedicine and the availability of in person appointments. The patient expressed understanding and agreed to proceed.  Care Team   Patient Care Team: Deanna Deutscher, DO as PCP - General (Family Medicine) Deanna Craver, MD as Consulting Physician (Gastroenterology)  Subjective:   HPI:   Fibromyalgia She is still doing well on the Soma at night. Sleeping 4-6 hours per night. She takes Celebrex during the day. No oversedation. No other concerns today.  Osteoporosis Most current DEXA scan T-score is minus 2.9. Due for Prolia in the summer.  Low platelet count Had low platelet count when checked by Dr. Collene Mares recently, early March.   Screening for colon cancer Scheduled for colonoscopy 06/24/2018.   Burn of mouth Burned roof of mouth while eating. Did notice blister. Has been eating and drinking OK since then. Has chlorhexidine previously prescribed by her dentist, will start to use this.    Review of Systems  Constitutional: Negative for chills, fever, malaise/fatigue and weight loss.  Respiratory: Negative for cough, shortness of breath and wheezing.   Cardiovascular: Negative for chest pain, palpitations and leg swelling.  Gastrointestinal: Negative for abdominal pain, constipation, diarrhea, nausea and vomiting.  Genitourinary: Negative for dysuria and urgency.  Musculoskeletal: Positive for joint pain  and myalgias.  Skin: Negative for rash.  Neurological: Negative for dizziness and headaches.  Psychiatric/Behavioral: Negative for depression, substance abuse and suicidal ideas. The patient is not nervous/anxious.     Patient Active Problem List   Diagnosis Date Noted  . Pain in thoracic spine, followed by [facility] 08/30/2017  . Cervical radiculitis, followed by [facility] 08/30/2017  . Osteoarthritis 03/21/2017  . Endometrial polyp   . Osteoporosis, Rx Prolia   . Atrophic vaginitis   . Spinal stenosis   . Fibromyalgia, Rx Soma, stable for years     Social History   Tobacco Use  . Smoking status: Never Smoker  . Smokeless tobacco: Never Used  Substance Use Topics  . Alcohol use: Yes    Alcohol/week: 2.0 standard drinks    Types: 2 Standard drinks or equivalent per week    Current Outpatient Medications:  .  acetaminophen (TYLENOL) 500 MG tablet, Take 500 mg by mouth every 8 (eight) hours as needed., Disp: , Rfl:  .  celecoxib (CELEBREX) 200 MG capsule, Take 1 capsule (200 mg total) by mouth daily., Disp: 90 capsule, Rfl: 1 .  cholecalciferol (VITAMIN D) 1000 UNITS tablet, Take 1,000 Units by mouth daily., Disp: , Rfl:  .  cyanocobalamin 1000 MCG tablet, Take 1,000 mcg by mouth daily., Disp: , Rfl:  .  denosumab (PROLIA) 60 MG/ML SOSY injection, Inject 60 mg into the skin every 6 (six) months., Disp: , Rfl:  .  Magnesium 250 MG TABS, Take 250 mg by mouth daily., Disp: , Rfl:  .  calcium carbonate (OS-CAL) 600 MG TABS, Take 600 mg by mouth daily with breakfast., Disp: , Rfl:  .  carisoprodol (SOMA) 350 MG tablet, Take 1  tablet (350 mg total) by mouth at bedtime as needed for muscle spasms., Disp: 90 tablet, Rfl: 2  Allergies  Allergen Reactions  . Ciprofloxacin Nausea Only    Objective:   VITALS: Per patient if applicable, see vitals. GENERAL: Alert, appears well and in no acute distress. HEENT: Atraumatic, conjunctiva clear, no obvious abnormalities on inspection  of external nose and ears. NECK: Normal movements of the head and neck. CARDIOPULMONARY: No increased WOB. Speaking in clear sentences. I:E ratio WNL.  MS: Moves all visible extremities without noticeable abnormality. PSYCH: Pleasant and cooperative, well-groomed. Speech normal rate and rhythm. Affect is appropriate. Insight and judgement are appropriate. Attention is focused, linear, and appropriate.  NEURO: CN grossly intact. Oriented as arrived to appointment on time with no prompting. Moves both UE equally.  SKIN: No obvious lesions, wounds, erythema, or cyanosis noted on face or hands.  Depression screen [facility] 2/9 12/12/2017 11/29/2016  Decreased Interest 0 0  Down, Depressed, Hopeless 0 0  PHQ - 2 Score 0 0    Assessment and Plan:   [name] was seen today for follow-up.  Diagnoses and all orders for this visit:  Fibromyalgia -     carisoprodol (SOMA) 350 MG tablet; Take 1 tablet (350 mg total) by mouth at bedtime as needed for muscle spasms.  Cervical radiculitis -     carisoprodol (SOMA) 350 MG tablet; Take 1 tablet (350 mg total) by mouth at bedtime as needed for muscle spasms.  Thrombocytopenia (HCC) -     CBC with Differential/Platelet; Future -     Comprehensive metabolic panel   . COVID-19 Education: The signs and symptoms of COVID-19 were discussed with the patient and how to seek care for testing if needed. The importance of social distancing was discussed today. . Reviewed expectations re: course of current medical issues. . Discussed self-management of symptoms. . Outlined signs and symptoms indicating need for more acute intervention. . Patient verbalized understanding and all questions were answered. Marland Kitchen Health Maintenance issues including appropriate healthy diet, exercise, and smoking avoidance were discussed with patient. . See orders for this visit as documented in the electronic medical record.  Deanna Deutscher, DO  Records requested if needed. Time spent: 25  minutes, of which >50% was spent in obtaining information about her symptoms, reviewing her previous labs, evaluations, and treatments, counseling her about her condition (please see the discussed topics above), and developing a plan to further investigate it; she had a number of questions which I addressed.

## 2018-06-12 ENCOUNTER — Ambulatory Visit (INDEPENDENT_AMBULATORY_CARE_PROVIDER_SITE_OTHER): Payer: Medicare Other | Admitting: Family Medicine

## 2018-06-12 ENCOUNTER — Encounter: Payer: Self-pay | Admitting: Family Medicine

## 2018-06-12 VITALS — Ht 61.5 in | Wt 112.0 lb

## 2018-06-12 DIAGNOSIS — M5412 Radiculopathy, cervical region: Secondary | ICD-10-CM | POA: Diagnosis not present

## 2018-06-12 DIAGNOSIS — M797 Fibromyalgia: Secondary | ICD-10-CM | POA: Diagnosis not present

## 2018-06-12 DIAGNOSIS — D696 Thrombocytopenia, unspecified: Secondary | ICD-10-CM | POA: Diagnosis not present

## 2018-06-12 MED ORDER — CARISOPRODOL 350 MG PO TABS: 350.0000 mg | ORAL_TABLET | Freq: Every evening | ORAL | 2 refills | Status: DC | PRN

## 2018-06-13 ENCOUNTER — Other Ambulatory Visit (INDEPENDENT_AMBULATORY_CARE_PROVIDER_SITE_OTHER): Payer: Medicare Other

## 2018-06-13 ENCOUNTER — Other Ambulatory Visit: Payer: Self-pay

## 2018-06-13 DIAGNOSIS — D696 Thrombocytopenia, unspecified: Secondary | ICD-10-CM

## 2018-06-13 LAB — CBC WITH DIFFERENTIAL/PLATELET
Basophils Absolute: 0 10*3/uL (ref 0.0–0.1)
Basophils Relative: 0.5 % (ref 0.0–3.0)
Eosinophils Absolute: 0.1 10*3/uL (ref 0.0–0.7)
Eosinophils Relative: 1 % (ref 0.0–5.0)
HCT: 38.4 % (ref 36.0–46.0)
Hemoglobin: 13.2 g/dL (ref 12.0–15.0)
Lymphocytes Relative: 30.1 % (ref 12.0–46.0)
Lymphs Abs: 2.1 10*3/uL (ref 0.7–4.0)
MCHC: 34.2 g/dL (ref 30.0–36.0)
MCV: 90.4 fl (ref 78.0–100.0)
Monocytes Absolute: 0.7 10*3/uL (ref 0.1–1.0)
Monocytes Relative: 9.8 % (ref 3.0–12.0)
Neutro Abs: 4.2 10*3/uL (ref 1.4–7.7)
Neutrophils Relative %: 58.6 % (ref 43.0–77.0)
Platelets: 183 10*3/uL (ref 150.0–400.0)
RBC: 4.25 Mil/uL (ref 3.87–5.11)
RDW: 13 % (ref 11.5–15.5)
WBC: 7.1 10*3/uL (ref 4.0–10.5)

## 2018-06-24 DIAGNOSIS — Z86010 Personal history of colonic polyps: Secondary | ICD-10-CM | POA: Diagnosis not present

## 2018-06-24 DIAGNOSIS — Z1211 Encounter for screening for malignant neoplasm of colon: Secondary | ICD-10-CM | POA: Diagnosis not present

## 2018-06-24 DIAGNOSIS — D128 Benign neoplasm of rectum: Secondary | ICD-10-CM | POA: Diagnosis not present

## 2018-06-24 DIAGNOSIS — D122 Benign neoplasm of ascending colon: Secondary | ICD-10-CM | POA: Diagnosis not present

## 2018-06-24 DIAGNOSIS — K635 Polyp of colon: Secondary | ICD-10-CM | POA: Diagnosis not present

## 2018-06-24 DIAGNOSIS — D120 Benign neoplasm of cecum: Secondary | ICD-10-CM | POA: Diagnosis not present

## 2018-06-24 DIAGNOSIS — K621 Rectal polyp: Secondary | ICD-10-CM | POA: Diagnosis not present

## 2018-06-24 LAB — HM COLONOSCOPY

## 2018-06-28 ENCOUNTER — Encounter: Payer: Self-pay | Admitting: Family Medicine

## 2018-07-26 ENCOUNTER — Other Ambulatory Visit: Payer: Self-pay | Admitting: Family Medicine

## 2018-07-26 DIAGNOSIS — M797 Fibromyalgia: Secondary | ICD-10-CM

## 2018-07-26 DIAGNOSIS — M5412 Radiculopathy, cervical region: Secondary | ICD-10-CM

## 2018-07-26 NOTE — Telephone Encounter (Signed)
Rx request Last Fill 12/12/17  #90/1 Last OV 06/12/18 Next OV 12/13/18

## 2018-08-02 ENCOUNTER — Other Ambulatory Visit: Payer: Self-pay | Admitting: Internal Medicine

## 2018-08-02 DIAGNOSIS — Z20822 Contact with and (suspected) exposure to covid-19: Secondary | ICD-10-CM

## 2018-08-02 DIAGNOSIS — R6889 Other general symptoms and signs: Secondary | ICD-10-CM | POA: Diagnosis not present

## 2018-08-05 LAB — NOVEL CORONAVIRUS, NAA: SARS-CoV-2, NAA: NOT DETECTED

## 2018-08-09 ENCOUNTER — Encounter: Payer: Self-pay | Admitting: Family Medicine

## 2018-08-09 ENCOUNTER — Telehealth: Payer: Self-pay | Admitting: General Practice

## 2018-08-09 NOTE — Telephone Encounter (Signed)
Pt's/spouse called in to follow up on Covid results. Advised of negative result.

## 2018-08-21 ENCOUNTER — Ambulatory Visit (INDEPENDENT_AMBULATORY_CARE_PROVIDER_SITE_OTHER): Payer: Medicare Other

## 2018-08-21 ENCOUNTER — Other Ambulatory Visit: Payer: Self-pay

## 2018-08-21 DIAGNOSIS — M810 Age-related osteoporosis without current pathological fracture: Secondary | ICD-10-CM | POA: Diagnosis not present

## 2018-08-21 MED ORDER — DENOSUMAB 60 MG/ML ~~LOC~~ SOSY
60.0000 mg | PREFILLED_SYRINGE | Freq: Once | SUBCUTANEOUS | Status: AC
  Administered 2018-08-21: 11:00:00 60 mg via SUBCUTANEOUS

## 2018-08-21 NOTE — Progress Notes (Addendum)
Per orders of Dr.Wallace, injection of Prolia  given by Sandford Craze RN  Patient tolerated injection well.

## 2018-08-28 DIAGNOSIS — Z85828 Personal history of other malignant neoplasm of skin: Secondary | ICD-10-CM | POA: Diagnosis not present

## 2018-08-28 DIAGNOSIS — L821 Other seborrheic keratosis: Secondary | ICD-10-CM | POA: Diagnosis not present

## 2018-08-28 DIAGNOSIS — L812 Freckles: Secondary | ICD-10-CM | POA: Diagnosis not present

## 2018-08-28 DIAGNOSIS — L820 Inflamed seborrheic keratosis: Secondary | ICD-10-CM | POA: Diagnosis not present

## 2018-08-28 DIAGNOSIS — D1801 Hemangioma of skin and subcutaneous tissue: Secondary | ICD-10-CM | POA: Diagnosis not present

## 2018-09-04 NOTE — Progress Notes (Signed)
Noted and agree. Deanna Mane, DO 

## 2018-10-09 ENCOUNTER — Encounter: Payer: Self-pay | Admitting: Obstetrics and Gynecology

## 2018-10-09 DIAGNOSIS — Z1231 Encounter for screening mammogram for malignant neoplasm of breast: Secondary | ICD-10-CM | POA: Diagnosis not present

## 2018-11-12 ENCOUNTER — Other Ambulatory Visit: Payer: Self-pay

## 2018-11-13 ENCOUNTER — Telehealth: Payer: Self-pay

## 2018-11-13 ENCOUNTER — Ambulatory Visit (INDEPENDENT_AMBULATORY_CARE_PROVIDER_SITE_OTHER): Payer: Medicare Other | Admitting: Obstetrics and Gynecology

## 2018-11-13 ENCOUNTER — Encounter: Payer: Self-pay | Admitting: Obstetrics and Gynecology

## 2018-11-13 VITALS — BP 118/76 | HR 80 | Temp 97.1°F | Ht 61.5 in | Wt 114.6 lb

## 2018-11-13 DIAGNOSIS — Z8739 Personal history of other diseases of the musculoskeletal system and connective tissue: Secondary | ICD-10-CM

## 2018-11-13 DIAGNOSIS — Z124 Encounter for screening for malignant neoplasm of cervix: Secondary | ICD-10-CM | POA: Diagnosis not present

## 2018-11-13 DIAGNOSIS — Z01419 Encounter for gynecological examination (general) (routine) without abnormal findings: Secondary | ICD-10-CM | POA: Diagnosis not present

## 2018-11-13 NOTE — Patient Instructions (Signed)
EXERCISE AND DIET:  We recommended that you start or continue a regular exercise program for good health. Regular exercise means any activity that makes your heart beat faster and makes you sweat.  We recommend exercising at least 30 minutes per day at least 3 days a week, preferably 4 or 5.  We also recommend a diet low in fat and sugar.  Inactivity, poor dietary choices and obesity can cause diabetes, heart attack, stroke, and kidney damage, among others.    ALCOHOL AND SMOKING:  Women should limit their alcohol intake to no more than 7 drinks/beers/glasses of wine (combined, not each!) per week. Moderation of alcohol intake to this level decreases your risk of breast cancer and liver damage. And of course, no recreational drugs are part of a healthy lifestyle.  And absolutely no smoking or even second hand smoke. Most people know smoking can cause heart and lung diseases, but did you know it also contributes to weakening of your bones? Aging of your skin?  Yellowing of your teeth and nails?  CALCIUM AND VITAMIN D:  Adequate intake of calcium and Vitamin D are recommended.  The recommendations for exact amounts of these supplements seem to change often, but generally speaking 1,200 mg of calcium (between diet and supplement) and 800 units of Vitamin D per day seems prudent. Certain women may benefit from higher intake of Vitamin D.  If you are among these women, your doctor will have told you during your visit.    PAP SMEARS:  Pap smears, to check for cervical cancer or precancers,  have traditionally been done yearly, although recent scientific advances have shown that most women can have pap smears less often.  However, every woman still should have a physical exam from her gynecologist every year. It will include a breast check, inspection of the vulva and vagina to check for abnormal growths or skin changes, a visual exam of the cervix, and then an exam to evaluate the size and shape of the uterus and  ovaries.  And after 68 years of age, a rectal exam is indicated to check for rectal cancers. We will also provide age appropriate advice regarding health maintenance, like when you should have certain vaccines, screening for sexually transmitted diseases, bone density testing, colonoscopy, mammograms, etc.   MAMMOGRAMS:  All women over 40 years old should have a yearly mammogram. Many facilities now offer a "3D" mammogram, which may cost around $50 extra out of pocket. If possible,  we recommend you accept the option to have the 3D mammogram performed.  It both reduces the number of women who will be called back for extra views which then turn out to be normal, and it is better than the routine mammogram at detecting truly abnormal areas.    COLON CANCER SCREENING: Now recommend starting at age 45. At this time colonoscopy is not covered for routine screening until 50. There are take home tests that can be done between 45-49.   COLONOSCOPY:  Colonoscopy to screen for colon cancer is recommended for all women at age 50.  We know, you hate the idea of the prep.  We agree, BUT, having colon cancer and not knowing it is worse!!  Colon cancer so often starts as a polyp that can be seen and removed at colonscopy, which can quite literally save your life!  And if your first colonoscopy is normal and you have no family history of colon cancer, most women don't have to have it again for   10 years.  Once every ten years, you can do something that may end up saving your life, right?  We will be happy to help you get it scheduled when you are ready.  Be sure to check your insurance coverage so you understand how much it will cost.  It may be covered as a preventative service at no cost, but you should check your particular policy.      Breast Self-Awareness Breast self-awareness means being familiar with how your breasts look and feel. It involves checking your breasts regularly and reporting any changes to your  health care provider. Practicing breast self-awareness is important. A change in your breasts can be a sign of a serious medical problem. Being familiar with how your breasts look and feel allows you to find any problems early, when treatment is more likely to be successful. All women should practice breast self-awareness, including women who have had breast implants. How to do a breast self-exam One way to learn what is normal for your breasts and whether your breasts are changing is to do a breast self-exam. To do a breast self-exam: Look for Changes  1. Remove all the clothing above your waist. 2. Stand in front of a mirror in a room with good lighting. 3. Put your hands on your hips. 4. Push your hands firmly downward. 5. Compare your breasts in the mirror. Look for differences between them (asymmetry), such as: ? Differences in shape. ? Differences in size. ? Puckers, dips, and bumps in one breast and not the other. 6. Look at each breast for changes in your skin, such as: ? Redness. ? Scaly areas. 7. Look for changes in your nipples, such as: ? Discharge. ? Bleeding. ? Dimpling. ? Redness. ? A change in position. Feel for Changes Carefully feel your breasts for lumps and changes. It is best to do this while lying on your back on the floor and again while sitting or standing in the shower or tub with soapy water on your skin. Feel each breast in the following way:  Place the arm on the side of the breast you are examining above your head.  Feel your breast with the other hand.  Start in the nipple area and make  inch (2 cm) overlapping circles to feel your breast. Use the pads of your three middle fingers to do this. Apply light pressure, then medium pressure, then firm pressure. The light pressure will allow you to feel the tissue closest to the skin. The medium pressure will allow you to feel the tissue that is a little deeper. The firm pressure will allow you to feel the tissue  close to the ribs.  Continue the overlapping circles, moving downward over the breast until you feel your ribs below your breast.  Move one finger-width toward the center of the body. Continue to use the  inch (2 cm) overlapping circles to feel your breast as you move slowly up toward your collarbone.  Continue the up and down exam using all three pressures until you reach your armpit.  Write Down What You Find  Write down what is normal for each breast and any changes that you find. Keep a written record with breast changes or normal findings for each breast. By writing this information down, you do not need to depend only on memory for size, tenderness, or location. Write down where you are in your menstrual cycle, if you are still menstruating. If you are having trouble noticing differences   in your breasts, do not get discouraged. With time you will become more familiar with the variations in your breasts and more comfortable with the exam. How often should I examine my breasts? Examine your breasts every month. If you are breastfeeding, the best time to examine your breasts is after a feeding or after using a breast pump. If you menstruate, the best time to examine your breasts is 5-7 days after your period is over. During your period, your breasts are lumpier, and it may be more difficult to notice changes. When should I see my health care provider? See your health care provider if you notice:  A change in shape or size of your breasts or nipples.  A change in the skin of your breast or nipples, such as a reddened or scaly area.  Unusual discharge from your nipples.  A lump or thick area that was not there before.  Pain in your breasts.  Anything that concerns you.  

## 2018-11-13 NOTE — Telephone Encounter (Signed)
Copied from [place] [id number]. Topic: Referral - Request for Referral >> Nov 13, 2018  2:47 PM Carolyn Stare wrote: Pt need a referral to [facility] on [facility] for a Bone Density

## 2018-11-13 NOTE — Progress Notes (Signed)
68 y.o. G42P2002 Married White or Caucasian Not Hispanic or Latino female here for annual exam.   No vaginal bleeding. Not sexually active.     No LMP recorded. Patient is postmenopausal.          Sexually active: No.  The current method of family planning is tubal ligation.    Exercising: Yes.    pilates, walking Smoker:  no  Health Maintenance: Pap:  10/2016 WNL negative HPV, 07/24/2012 WNL NEG HPV History of abnormal Pap:  no MMG:  10/09/2018 Birads 1 negative BMD:   11/2016, osteoporosis (-2.6 right femur, -2.9 left femur). She was on Prolia in the past, taken off by Rheumatology in 2016. Restarted Prolia in Spring 2019. Colonoscopy: 06/24/2018, polyps, f/u in 7 years (was told she will be over 23, will not do again unless she is having issues).  TDaP:  2013 Gardasil: never   reports that she has never smoked. She has never used smokeless tobacco. She reports current alcohol use of about 2.0 standard drinks of alcohol per week. She reports that she does not use drugs. Retired. 2 kids ([place]), 4 biological grandchildren, 6 step-grandchildren.   Past Medical History:  Diagnosis Date  . Atrophic vaginitis   . Chronic urinary tract infection   . Endometrial polyp   . Fibromyalgia   . GERD (gastroesophageal reflux disease)   . H/O hiatal hernia   . Osteoporosis   . PONV (postoperative nausea and vomiting)   . Spinal stenosis     Past Surgical History:  Procedure Laterality Date  . BARTHOLIN CYST MARSUPIALIZATION    . BUNIONECTOMY    . DILATION AND CURETTAGE OF UTERUS    . EAR LOBE SURGERY    . FACIAL COSMETIC SURGERY    . KNEE SURGERY Left   . LUMBAR FUSION    . TUBAL LIGATION      Current Outpatient Medications  Medication Sig Dispense Refill  . acetaminophen (TYLENOL) 500 MG tablet Take 500 mg by mouth every 8 (eight) hours as needed.    . calcium carbonate (OS-CAL) 600 MG TABS Take 600 mg by mouth daily with breakfast.    . carisoprodol (SOMA) 350 MG tablet  Take 1 tablet (350 mg total) by mouth at bedtime as needed for muscle spasms. 90 tablet 2  . celecoxib (CELEBREX) 200 MG capsule TAKE 1 CAPSULE BY MOUTH EVERY DAY 90 capsule 1  . cholecalciferol (VITAMIN D) 1000 UNITS tablet Take 1,000 Units by mouth daily.    . cyanocobalamin 1000 MCG tablet Take 1,000 mcg by mouth daily.    Marland Kitchen denosumab (PROLIA) 60 MG/ML SOSY injection Inject 60 mg into the skin every 6 (six) months.    . Magnesium 250 MG TABS Take 250 mg by mouth daily.     No current facility-administered medications for this visit.     Family History  Problem Relation Age of Onset  . CAD Mother 73  . Dementia Mother   . Hypertension Father   . Heart disease Father        Pacemaker  . Other Father        Myasthenia Gravis  . Melanoma Father   . Dementia Father   . CAD Maternal Grandfather 71       Died of MI    Review of Systems  Constitutional: Negative.   HENT: Negative.   Eyes: Negative.   Respiratory: Negative.   Cardiovascular: Negative.   Gastrointestinal: Negative.   Endocrine: Negative.   Genitourinary:  Negative.   Musculoskeletal: Negative.   Skin: Negative.   Allergic/Immunologic: Negative.   Neurological: Negative.   Hematological: Negative.   Psychiatric/Behavioral: Negative.     Exam:   BP 118/76 (BP Location: Right Arm, Patient Position: Sitting, Cuff Size: Normal)   Pulse 80   Temp (!) 97.1 F (36.2 C) (Temporal)   Ht 5' 1.5" (1.562 m)   Wt 114 lb 9.6 oz (52 kg)   BMI 21.30 kg/m   Weight change: @WEIGHTCHANGE @ Height:   Height: 5' 1.5" (156.2 cm)  Ht Readings from Last 3 Encounters:  11/13/18 5' 1.5" (1.562 m)  06/12/18 5' 1.5" (1.562 m)  12/12/17 5' 1.5" (1.562 m)    General appearance: alert, cooperative and appears stated age Head: Normocephalic, without obvious abnormality, atraumatic Neck: no adenopathy, supple, symmetrical, trachea midline and thyroid normal to inspection and palpation Lungs: clear to auscultation  bilaterally Cardiovascular: regular rate and rhythm Breasts: normal appearance, no masses or tenderness Abdomen: soft, non-tender; non distended,  no masses,  no organomegaly Extremities: extremities normal, atraumatic, no cyanosis or edema Skin: Skin color, texture, turgor normal. No rashes or lesions Lymph nodes: Cervical, supraclavicular, and axillary nodes normal. No abnormal inguinal nodes palpated Neurologic: Grossly normal   Pelvic: External genitalia:  no lesions              Urethra:  normal appearing urethra with no masses, tenderness or lesions              Bartholins and Skenes: normal                 Vagina: atrophic appearing vagina with normal color and discharge, no lesions              Cervix: no lesions               Bimanual Exam:  Uterus:  normal size, contour, position, consistency, mobility, non-tender              Adnexa: no mass, fullness, tenderness               Rectovaginal: Confirms               Anus:  normal sphincter tone, no lesions  Chaperone was present for exam.  A:  Well Woman with normal exam  Osteoporosis, on prolia  P:   No pap this year  Mammogram UTD  Colonoscopy UTD  DEXA with primary  Labs with primary  Discussed breast self exam  Discussed calcium and vit D intake

## 2018-11-18 ENCOUNTER — Other Ambulatory Visit: Payer: Self-pay

## 2018-11-18 DIAGNOSIS — M810 Age-related osteoporosis without current pathological fracture: Secondary | ICD-10-CM

## 2018-11-18 NOTE — Telephone Encounter (Signed)
Order sent my chart message to patient.

## 2018-12-06 DIAGNOSIS — Z8262 Family history of osteoporosis: Secondary | ICD-10-CM | POA: Diagnosis not present

## 2018-12-06 DIAGNOSIS — M810 Age-related osteoporosis without current pathological fracture: Secondary | ICD-10-CM | POA: Diagnosis not present

## 2018-12-06 LAB — HM DEXA SCAN

## 2018-12-13 ENCOUNTER — Encounter: Payer: Medicare Other | Admitting: Family Medicine

## 2018-12-23 ENCOUNTER — Other Ambulatory Visit: Payer: Self-pay

## 2018-12-23 ENCOUNTER — Ambulatory Visit (INDEPENDENT_AMBULATORY_CARE_PROVIDER_SITE_OTHER): Payer: Medicare Other | Admitting: Family Medicine

## 2018-12-23 ENCOUNTER — Encounter: Payer: Self-pay | Admitting: Family Medicine

## 2018-12-23 VITALS — BP 110/66 | HR 65 | Temp 97.7°F | Ht 61.5 in | Wt 115.2 lb

## 2018-12-23 DIAGNOSIS — M797 Fibromyalgia: Secondary | ICD-10-CM

## 2018-12-23 DIAGNOSIS — M816 Localized osteoporosis [Lequesne]: Secondary | ICD-10-CM

## 2018-12-23 DIAGNOSIS — M1990 Unspecified osteoarthritis, unspecified site: Secondary | ICD-10-CM

## 2018-12-23 NOTE — Assessment & Plan Note (Signed)
Stable. Continue celebrex 200mg  daily.

## 2018-12-23 NOTE — Assessment & Plan Note (Signed)
Stable. Database with no red flags. Continue soma as needed.

## 2018-12-23 NOTE — Assessment & Plan Note (Signed)
Will obtain records for most recent DEXA. Continue calcium and vitamin D. Continue proliea.

## 2018-12-23 NOTE — Progress Notes (Signed)
   Chief Complaint:  ERYKAH MUFF is a 68 y.o. female who presents today with a chief complaint of fibromylagia and to transfer care.   Assessment/Plan:  Fibromyalgia, Rx Soma, stable for years Stable. Database with no red flags. Continue soma as needed.   Osteoporosis, Rx Prolia Will obtain records for most recent DEXA. Continue calcium and vitamin D. Continue proliea.   Osteoarthritis Stable. Continue celebrex 200mg  daily.   Follow up 6 months.     Subjective:  HPI:   Her stable, chronic medical conditions are outlined below:  # Fibromyalgia / Osteoarthritis  - On soma 350mg  daily and tolerating well.  - On celebrex 200mg  daily as needed - ROS: No excessive sedation or drowsiness.   # Osteoporosis - On prolia 60mg  every 6 months  - On calcium and vitamin D supplementation  ROS: Per HPI  PMH: She reports that she has never smoked. She has never used smokeless tobacco. She reports current alcohol use of about 2.0 standard drinks of alcohol per week. She reports that she does not use drugs.      Objective:  Physical Exam: BP 110/66   Pulse 65   Temp 97.7 F (36.5 C)   Ht 5' 1.5" (1.562 m)   Wt 115 lb 3.2 oz (52.3 kg)   SpO2 97%   BMI 21.41 kg/m   Gen: NAD, resting comfortably CV: Regular rate and rhythm with no murmurs appreciated Pulm: Normal work of breathing, clear to auscultation bilaterally with no crackles, wheezes, or rhonchi GI: Normal bowel sounds present. Soft, Nontender, Nondistended. MSK: No edema, cyanosis, or clubbing noted Skin: Warm, dry Neuro: Grossly normal, moves all extremities Psych: Normal affect and thought content      Caleb M. Jerline Pain, MD 12/23/2018 2:15 PM

## 2018-12-23 NOTE — Patient Instructions (Signed)
It was very nice to see you today!  No changes today.  I would like to see you back in 6 months, or sooner if needed.   Take care, Dr Jerline Pain  Please try these tips to maintain a healthy lifestyle:   Eat at least 3 REAL meals and 1-2 snacks per day.  Aim for no more than 5 hours between eating.  If you eat breakfast, please do so within one hour of getting up.    Obtain twice as many fruits/vegetables as protein or carbohydrate foods for both lunch and dinner. (Half of each meal should be fruits/vegetables, one quarter protein, and one quarter starchy carbs)   Cut down on sweet beverages. This includes juice, soda, and sweet tea.    Exercise at least 150 minutes every week.

## 2018-12-24 ENCOUNTER — Other Ambulatory Visit: Payer: Self-pay

## 2018-12-24 DIAGNOSIS — Z20822 Contact with and (suspected) exposure to covid-19: Secondary | ICD-10-CM

## 2018-12-24 DIAGNOSIS — Z20828 Contact with and (suspected) exposure to other viral communicable diseases: Secondary | ICD-10-CM | POA: Diagnosis not present

## 2018-12-25 ENCOUNTER — Encounter: Payer: Self-pay | Admitting: Family Medicine

## 2018-12-25 LAB — NOVEL CORONAVIRUS, NAA: SARS-CoV-2, NAA: NOT DETECTED

## 2019-01-23 ENCOUNTER — Ambulatory Visit: Payer: Medicare Other | Attending: Family

## 2019-01-23 DIAGNOSIS — Z20822 Contact with and (suspected) exposure to covid-19: Secondary | ICD-10-CM

## 2019-01-25 LAB — NOVEL CORONAVIRUS, NAA: SARS-CoV-2, NAA: NOT DETECTED

## 2019-02-11 ENCOUNTER — Telehealth: Payer: Self-pay | Admitting: Family Medicine

## 2019-02-11 NOTE — Telephone Encounter (Signed)
Deanna [name] called saying she got a MyChart notification about her dexa bone scan results being in but when she went to go look at them she said it does not show any information and wanted to know if they could be posted to her MyChart or if someone could give her a call regarding the results.

## 2019-02-12 NOTE — Telephone Encounter (Signed)
Patient wants to know if there are any changes on her Bone Density test from 12/06/2018

## 2019-02-12 NOTE — Telephone Encounter (Signed)
T score dropped by about 4% since last time but overall stable. Recommend we continue prolia injections as she has been doing.

## 2019-02-12 NOTE — Telephone Encounter (Signed)
Left detailed message.   

## 2019-03-03 ENCOUNTER — Other Ambulatory Visit: Payer: Self-pay

## 2019-03-03 DIAGNOSIS — M797 Fibromyalgia: Secondary | ICD-10-CM

## 2019-03-03 DIAGNOSIS — M5412 Radiculopathy, cervical region: Secondary | ICD-10-CM

## 2019-03-04 ENCOUNTER — Telehealth: Payer: Self-pay | Admitting: Family Medicine

## 2019-03-04 ENCOUNTER — Other Ambulatory Visit: Payer: Self-pay

## 2019-03-04 ENCOUNTER — Other Ambulatory Visit: Payer: Self-pay | Admitting: Family Medicine

## 2019-03-04 DIAGNOSIS — M5412 Radiculopathy, cervical region: Secondary | ICD-10-CM

## 2019-03-04 DIAGNOSIS — M797 Fibromyalgia: Secondary | ICD-10-CM

## 2019-03-04 MED ORDER — CELECOXIB 200 MG PO CAPS: ORAL_CAPSULE | ORAL | 1 refills | Status: DC

## 2019-03-04 NOTE — Telephone Encounter (Signed)
Rx sent in

## 2019-03-04 NOTE — Telephone Encounter (Signed)
  LAST APPOINTMENT DATE: 03/04/2019   NEXT APPOINTMENT DATE:@5 /18/2021  MEDICATION:celecoxib (CELEBREX) 200 MG capsule  PHARMACY: [id number] - [place], [place] - [address] [place] Phone:  [phone]  Fax:  [phone]       **Let patient know to contact pharmacy at the end of the day to make sure medication is ready. **  ** Please notify patient to allow 48-72 hours to process**  **Encourage patient to contact the pharmacy for refills or they can request refills through [facility]**  CLINICAL FILLS OUT ALL BELOW:   LAST REFILL:  QTY:  REFILL DATE:    OTHER COMMENTS:    Okay for refill?  Please advise

## 2019-03-04 NOTE — Telephone Encounter (Signed)
Please review

## 2019-03-05 ENCOUNTER — Other Ambulatory Visit: Payer: Self-pay

## 2019-03-05 DIAGNOSIS — M797 Fibromyalgia: Secondary | ICD-10-CM

## 2019-03-05 DIAGNOSIS — M5412 Radiculopathy, cervical region: Secondary | ICD-10-CM

## 2019-03-05 MED ORDER — CARISOPRODOL 350 MG PO TABS: 350.0000 mg | ORAL_TABLET | Freq: Every evening | ORAL | 2 refills | Status: DC | PRN

## 2019-03-06 NOTE — Telephone Encounter (Signed)
Last OV 12/23/18 Last refill 03/05/19 #90/2 Next OV 06/24/19

## 2019-03-18 ENCOUNTER — Telehealth: Payer: Self-pay

## 2019-03-18 NOTE — Telephone Encounter (Signed)
If she has already had one she does not need a appt

## 2019-03-18 NOTE — Telephone Encounter (Signed)
Error

## 2019-03-18 NOTE — Telephone Encounter (Signed)
Patient calling requesting prolia injection.Marland Kitchen last prolia injection was 08/21/2018. Unsure if patient need appt with the dr first or not.

## 2019-03-25 ENCOUNTER — Ambulatory Visit: Payer: Medicare Other

## 2019-04-01 ENCOUNTER — Ambulatory Visit: Payer: Medicare Other

## 2019-04-01 ENCOUNTER — Other Ambulatory Visit: Payer: Self-pay

## 2019-04-07 ENCOUNTER — Other Ambulatory Visit: Payer: Self-pay

## 2019-04-07 ENCOUNTER — Ambulatory Visit (INDEPENDENT_AMBULATORY_CARE_PROVIDER_SITE_OTHER): Payer: Medicare Other

## 2019-04-07 VITALS — BP 112/70 | HR 68 | Temp 97.8°F | Ht 62.0 in | Wt 117.0 lb

## 2019-04-07 DIAGNOSIS — M810 Age-related osteoporosis without current pathological fracture: Secondary | ICD-10-CM | POA: Diagnosis not present

## 2019-04-07 DIAGNOSIS — Z0000 Encounter for general adult medical examination without abnormal findings: Secondary | ICD-10-CM

## 2019-04-07 MED ORDER — DENOSUMAB 60 MG/ML ~~LOC~~ SOSY
60.0000 mg | PREFILLED_SYRINGE | Freq: Once | SUBCUTANEOUS | Status: AC
  Administered 2019-04-07: 60 mg via SUBCUTANEOUS

## 2019-04-07 NOTE — Progress Notes (Signed)
Subjective:   Deanna [name] is a 69 y.o. female who presents for an Initial Medicare Annual Wellness Visit.  Review of Systems     Cardiac Risk Factors include: advanced age (>63men, >80 women)    Objective:    Today's Vitals   04/07/19 1513  BP: 112/70  Pulse: 68  Temp: 97.8 F (36.6 C)  TempSrc: Temporal  SpO2: 97%  Weight: 117 lb (53.1 kg)  Height: 5\' 2"  (1.575 m)   Body mass index is 21.4 kg/m.  Advanced Directives 04/07/2019 08/20/2012 08/12/2012  Does Patient Have a Medical Advance Directive? Yes Patient does not have advance directive;Patient would not like information (No Data)  Type of Advance Directive Living will;Healthcare Power of Attorney - -  Does patient want to make changes to medical advance directive? No - Patient declined - -  Copy of [place] in Chart? No - copy requested - -  Pre-existing out of facility DNR order (yellow form or pink MOST form) - No -    Current Medications (verified) Outpatient Encounter Medications as of 04/07/2019  Medication Sig  . acetaminophen (TYLENOL) 500 MG tablet Take 500 mg by mouth every 8 (eight) hours as needed.  . calcium carbonate (OS-CAL) 600 MG TABS Take 600 mg by mouth daily with breakfast.  . carisoprodol (SOMA) 350 MG tablet Take 1 tablet (350 mg total) by mouth at bedtime as needed for muscle spasms.  . celecoxib (CELEBREX) 200 MG capsule TAKE 1 CAPSULE BY MOUTH EVERY DAY  . cholecalciferol (VITAMIN D) 1000 UNITS tablet Take 1,000 Units by mouth daily.  . cyanocobalamin 1000 MCG tablet Take 1,000 mcg by mouth daily.  Marland Kitchen denosumab (PROLIA) 60 MG/ML SOSY injection Inject 60 mg into the skin every 6 (six) months.  . Magnesium 250 MG TABS Take 250 mg by mouth daily.  . [EXPIRED] denosumab (PROLIA) injection 60 mg    No facility-administered encounter medications on file as of 04/07/2019.    Allergies (verified) Ciprofloxacin   History: Past Medical History:  Diagnosis Date  . Atrophic  vaginitis   . Chronic urinary tract infection   . Endometrial polyp   . Fibromyalgia   . GERD (gastroesophageal reflux disease)   . H/O hiatal hernia   . Osteoporosis   . PONV (postoperative nausea and vomiting)   . Spinal stenosis    Past Surgical History:  Procedure Laterality Date  . BARTHOLIN CYST MARSUPIALIZATION    . BUNIONECTOMY    . DILATION AND CURETTAGE OF UTERUS    . EAR LOBE SURGERY    . FACIAL COSMETIC SURGERY    . KNEE SURGERY Left   . LUMBAR FUSION    . TUBAL LIGATION     Family History  Problem Relation Age of Onset  . CAD Mother 62  . Dementia Mother   . Hypertension Father   . Heart disease Father        Pacemaker  . Other Father        Myasthenia Gravis  . Melanoma Father   . Dementia Father   . CAD Maternal Grandfather 13       Died of MI   Social History   Socioeconomic History  . Marital status: Married    Spouse name: Not on file  . Number of children: 2  . Years of education: Not on file  . Highest education level: Not on file  Occupational History  . Not on file  Tobacco Use  . Smoking  status: Never Smoker  . Smokeless tobacco: Never Used  Substance and Sexual Activity  . Alcohol use: Yes    Alcohol/week: 2.0 standard drinks    Types: 2 Standard drinks or equivalent per week  . Drug use: No  . Sexual activity: Not Currently    Partners: Male    Birth control/protection: Surgical    Comment: tubal ligation  Other Topics Concern  . Not on file  Social History Narrative   Lives at home with husband.     Social Determinants of Health   Financial Resource Strain:   . Difficulty of Paying Living Expenses: Not on file  Food Insecurity:   . Worried About Charity fundraiser in the Last Year: Not on file  . Ran Out of Food in the Last Year: Not on file  Transportation Needs:   . Lack of Transportation (Medical): Not on file  . Lack of Transportation (Non-Medical): Not on file  Physical Activity:   . Days of Exercise per Week:  Not on file  . Minutes of Exercise per Session: Not on file  Stress:   . Feeling of Stress : Not on file  Social Connections:   . Frequency of Communication with Friends and Family: Not on file  . Frequency of Social Gatherings with Friends and Family: Not on file  . Attends Religious Services: Not on file  . Active Member of Clubs or Organizations: Not on file  . Attends Archivist Meetings: Not on file  . Marital Status: Not on file    Tobacco Counseling Counseling given: Not Answered   Clinical Intake:  Pre-visit preparation completed: Yes  Pain : No/denies pain  Diabetes: No  How often do you need to have someone help you when you read instructions, pamphlets, or other written materials from your doctor or pharmacy?: 1 - Never  Interpreter Needed?: No  Information entered by :: Denman George LPN   Activities of Daily Living In your present state of health, do you have any difficulty performing the following activities: 04/07/2019  Hearing? N  Vision? N  Difficulty concentrating or making decisions? N  Walking or climbing stairs? N  Dressing or bathing? N  Doing errands, shopping? N  Preparing Food and eating ? N  Using the Toilet? N  In the past six months, have you accidently leaked urine? N  Do you have problems with loss of bowel control? N  Managing your Medications? N  Managing your Finances? N  Housekeeping or managing your Housekeeping? N  Some recent data might be hidden     Immunizations and Health Maintenance Immunization History  Administered Date(s) Administered  . Influenza,inj,Quad PF,6+ Mos 11/27/2017  . Influenza,inj,quad, With Preservative 04/06/2017  . Pneumococcal Conjugate-13 06/07/2016  . Pneumococcal Polysaccharide-23 06/12/2017  . Pneumococcal-Unspecified 04/07/2015  . Tdap 11/30/2011   There are no preventive care reminders to display for this patient.  Patient Care Team: Vivi Barrack, MD as PCP - General (Family  Medicine) Juanita Craver, MD as Consulting Physician (Gastroenterology) Salvadore Dom, MD as Consulting Physician (Obstetrics and Gynecology) Nat Christen, MD as Consulting Physician (Optometry)  Indicate any recent Medical Services you may have received from other than [facility] providers in the past year (date may be approximate).     Assessment:   This is a routine wellness examination for Danalynn.  Hearing/Vision screen No exam data present  Dietary issues and exercise activities discussed: Current Exercise Habits: Home exercise routine, Type of exercise: yoga;walking,  Time (Minutes): 30, Frequency (Times/Week): 3, Weekly Exercise (Minutes/Week): 90, Intensity: Mild  Goals   None    Depression Screen PHQ 2/9 Scores 04/07/2019 12/23/2018 12/12/2017 11/29/2016  PHQ - 2 Score 0 0 0 0  PHQ- 9 Score - 0 - -    Fall Risk Fall Risk  04/07/2019 12/23/2018 12/12/2017 11/29/2016  Falls in the past year? 0 0 0 No  Number falls in past yr: 0 - - -  Injury with Fall? 0 - - -  Follow up Falls evaluation completed;Falls prevention discussed;Education provided - - -    Is the patient's home free of loose throw rugs in walkways, pet beds, electrical cords, etc?   yes      Grab bars in the bathroom? yes      Handrails on the stairs?   yes      Adequate lighting?   yes  Timed Get Up and Go Performed completed and within normal timeframe; no gait abnormalities noted   Cognitive Function:     6CIT Screen 04/07/2019  What Year? 0 points  What month? 0 points  What time? 0 points  Count back from 20 0 points  Months in reverse 0 points  Repeat phrase 0 points  Total Score 0    Screening Tests Health Maintenance  Topic Date Due  . Hepatitis C Screening  04/06/2020 (Originally 1950-11-25)  . MAMMOGRAM  10/08/2020  . TETANUS/TDAP  11/29/2021  . COLONOSCOPY  06/23/2025  . INFLUENZA VACCINE  Completed  . DEXA SCAN  Completed  . PNA vac Low Risk Adult  Completed    Qualifies for  Shingles Vaccine? Discussed and patient will check with pharmacy for coverage.  Patient education handout provided   Cancer Screenings: Lung: Low Dose CT Chest recommended if Age 22-80 years, 30 pack-year currently smoking OR have quit w/in 15years. Patient does not qualify. Breast: Up to date on Mammogram? Yes   Up to date of Bone Density/Dexa? Yes Colorectal: colonoscopy 06/24/18 with Dr. Collene Mares (repeat in 7 years)    Plan:  I have personally reviewed and addressed the Medicare Annual Wellness questionnaire and have noted the following in the patient's chart:  A. Medical and social history B. Use of alcohol, tobacco or illicit drugs  C. Current medications and supplements D. Functional ability and status E.  Nutritional status F.  Physical activity G. Advance directives H. List of other physicians I.  Hospitalizations, surgeries, and ER visits in previous 12 months J.  [place] such as hearing and vision if needed, cognitive and depression L. Referrals, records requested, and appointments- none   In addition, I have reviewed and discussed with patient certain preventive protocols, quality metrics, and best practice recommendations. A written personalized care plan for preventive services as well as general preventive health recommendations were provided to patient.   Signed,  Denman George, LPN  Nurse Health Advisor   Nurse Notes: Patient has completed Covid vaccine.   Prolia injection given as ordered.  Patient tolerated well with no voiced complaints.  Scheduled for next injection in 6 months.

## 2019-04-07 NOTE — Patient Instructions (Signed)
Deanna [name] , Thank you for taking time to come for your Medicare Wellness Visit. I appreciate your ongoing commitment to your health goals. Please review the following plan we discussed and let me know if I can assist you in the future.   Screening recommendations/referrals: Colorectal Screening: up to date; last colonoscopy 06/24/18 Mammogram: up to date; last 10/09/18 Bone Density: up to date; last 12/06/18  Vision and Dental Exams: Recommended annual ophthalmology exams for early detection of glaucoma and other disorders of the eye Recommended annual dental exams for proper oral hygiene   Vaccinations: Influenza vaccine: completed 11/11/18 Pneumococcal vaccine: up to date; last 06/12/17 Tdap vaccine: up to date; last 11/30/11 Shingles vaccine: Please call your insurance company to determine your out of pocket expense for the Shingrix vaccine. You may receive this vaccine at your local pharmacy. (see handout)   Advanced directives: Please bring a copy of your POA (Power of Attorney) and/or Living Will to your next appointment.  Goals: Recommend to drink at least 6-8 8oz glasses of water per day and consume a balanced diet rich in fresh fruits and vegetables.   Next appointment: Please schedule your Annual Wellness Visit with your Nurse Health Advisor in one year.  Preventive Care 69 Years and Older, Female Preventive care refers to lifestyle choices and visits with your health care provider that can promote health and wellness. What does preventive care include?  A yearly physical exam. This is also called an annual well check.  Dental exams once or twice a year.  Routine eye exams. Ask your health care provider how often you should have your eyes checked.  Personal lifestyle choices, including:  Daily care of your teeth and gums.  Regular physical activity.  Eating a healthy diet.  Avoiding tobacco and drug use.  Limiting alcohol use.  Practicing safe sex.  Taking  low-dose aspirin every day if recommended by your health care provider.  Taking vitamin and mineral supplements as recommended by your health care provider. What happens during an annual well check? The services and screenings done by your health care provider during your annual well check will depend on your age, overall health, lifestyle risk factors, and family history of disease. Counseling  Your health care provider may ask you questions about your:  Alcohol use.  Tobacco use.  Drug use.  Emotional well-being.  Home and relationship well-being.  Sexual activity.  Eating habits.  History of falls.  Memory and ability to understand (cognition).  Work and work Statistician.  Reproductive health. Screening  You may have the following tests or measurements:  Height, weight, and BMI.  Blood pressure.  Lipid and cholesterol levels. These may be checked every 5 years, or more frequently if you are over 37 years old.  Skin check.  Lung cancer screening. You may have this screening every year starting at age 63 if you have a 30-pack-year history of smoking and currently smoke or have quit within the past 15 years.  Fecal occult blood test (FOBT) of the stool. You may have this test every year starting at age 55.  Flexible sigmoidoscopy or colonoscopy. You may have a sigmoidoscopy every 5 years or a colonoscopy every 10 years starting at age 76.  Hepatitis C blood test.  Hepatitis B blood test.  Sexually transmitted disease (STD) testing.  Diabetes screening. This is done by checking your blood sugar (glucose) after you have not eaten for a while (fasting). You may have this done every 1-3 years.  Bone density scan. This is done to screen for osteoporosis. You may have this done starting at age 67.  Mammogram. This may be done every 1-2 years. Talk to your health care provider about how often you should have regular mammograms. Talk with your health care provider  about your test results, treatment options, and if necessary, the need for more tests. Vaccines  Your health care provider may recommend certain vaccines, such as:  Influenza vaccine. This is recommended every year.  Tetanus, diphtheria, and acellular pertussis (Tdap, Td) vaccine. You may need a Td booster every 10 years.  Zoster vaccine. You may need this after age 82.  Pneumococcal 13-valent conjugate (PCV13) vaccine. One dose is recommended after age 34.  Pneumococcal polysaccharide (PPSV23) vaccine. One dose is recommended after age 74. Talk to your health care provider about which screenings and vaccines you need and how often you need them. This information is not intended to replace advice given to you by your health care provider. Make sure you discuss any questions you have with your health care provider. Document Released: 02/19/2015 Document Revised: 10/13/2015 Document Reviewed: 11/24/2014 Elsevier Interactive Patient Education  2017 [place] Prevention in the Home Falls can cause injuries. They can happen to people of all ages. There are many things you can do to make your home safe and to help prevent falls. What can I do on the outside of my home?  Regularly fix the edges of walkways and driveways and fix any cracks.  Remove anything that might make you trip as you walk through a door, such as a raised step or threshold.  Trim any bushes or trees on the path to your home.  Use bright outdoor lighting.  Clear any walking paths of anything that might make someone trip, such as rocks or tools.  Regularly check to see if handrails are loose or broken. Make sure that both sides of any steps have handrails.  Any raised decks and porches should have guardrails on the edges.  Have any leaves, snow, or ice cleared regularly.  Use sand or salt on walking paths during winter.  Clean up any spills in your garage right away. This includes oil or grease  spills. What can I do in the bathroom?  Use night lights.  Install grab bars by the toilet and in the tub and shower. Do not use towel bars as grab bars.  Use non-skid mats or decals in the tub or shower.  If you need to sit down in the shower, use a plastic, non-slip stool.  Keep the floor dry. Clean up any water that spills on the floor as soon as it happens.  Remove soap buildup in the tub or shower regularly.  Attach bath mats securely with double-sided non-slip rug tape.  Do not have throw rugs and other things on the floor that can make you trip. What can I do in the bedroom?  Use night lights.  Make sure that you have a light by your bed that is easy to reach.  Do not use any sheets or blankets that are too big for your bed. They should not hang down onto the floor.  Have a firm chair that has side arms. You can use this for support while you get dressed.  Do not have throw rugs and other things on the floor that can make you trip. What can I do in the kitchen?  Clean up any spills right away.  Avoid walking  on wet floors.  Keep items that you use a lot in easy-to-reach places.  If you need to reach something above you, use a strong step stool that has a grab bar.  Keep electrical cords out of the way.  Do not use floor polish or wax that makes floors slippery. If you must use wax, use non-skid floor wax.  Do not have throw rugs and other things on the floor that can make you trip. What can I do with my stairs?  Do not leave any items on the stairs.  Make sure that there are handrails on both sides of the stairs and use them. Fix handrails that are broken or loose. Make sure that handrails are as long as the stairways.  Check any carpeting to make sure that it is firmly attached to the stairs. Fix any carpet that is loose or worn.  Avoid having throw rugs at the top or bottom of the stairs. If you do have throw rugs, attach them to the floor with carpet  tape.  Make sure that you have a light switch at the top of the stairs and the bottom of the stairs. If you do not have them, ask someone to add them for you. What else can I do to help prevent falls?  Wear shoes that:  Do not have high heels.  Have rubber bottoms.  Are comfortable and fit you well.  Are closed at the toe. Do not wear sandals.  If you use a stepladder:  Make sure that it is fully opened. Do not climb a closed stepladder.  Make sure that both sides of the stepladder are locked into place.  Ask someone to hold it for you, if possible.  Clearly mark and make sure that you can see:  Any grab bars or handrails.  First and last steps.  Where the edge of each step is.  Use tools that help you move around (mobility aids) if they are needed. These include:  Canes.  Walkers.  Scooters.  Crutches.  Turn on the lights when you go into a dark area. Replace any light bulbs as soon as they burn out.  Set up your furniture so you have a clear path. Avoid moving your furniture around.  If any of your floors are uneven, fix them.  If there are any pets around you, be aware of where they are.  Review your medicines with your doctor. Some medicines can make you feel dizzy. This can increase your chance of falling. Ask your doctor what other things that you can do to help prevent falls. This information is not intended to replace advice given to you by your health care provider. Make sure you discuss any questions you have with your health care provider. Document Released: 11/19/2008 Document Revised: 07/01/2015 Document Reviewed: 02/27/2014 Elsevier Interactive Patient Education  2017 [employer].

## 2019-04-30 ENCOUNTER — Ambulatory Visit: Payer: Medicare Other | Attending: Internal Medicine

## 2019-04-30 DIAGNOSIS — Z20822 Contact with and (suspected) exposure to covid-19: Secondary | ICD-10-CM | POA: Diagnosis not present

## 2019-05-01 LAB — NOVEL CORONAVIRUS, NAA: SARS-CoV-2, NAA: NOT DETECTED

## 2019-05-01 LAB — SARS-COV-2, NAA 2 DAY TAT

## 2019-06-24 ENCOUNTER — Ambulatory Visit: Payer: Medicare Other | Admitting: Family Medicine

## 2019-06-25 DIAGNOSIS — M25561 Pain in right knee: Secondary | ICD-10-CM | POA: Diagnosis not present

## 2019-06-25 DIAGNOSIS — M1712 Unilateral primary osteoarthritis, left knee: Secondary | ICD-10-CM | POA: Diagnosis not present

## 2019-07-04 ENCOUNTER — Other Ambulatory Visit: Payer: Self-pay

## 2019-07-08 ENCOUNTER — Encounter: Payer: Self-pay | Admitting: Family Medicine

## 2019-07-08 ENCOUNTER — Other Ambulatory Visit: Payer: Self-pay

## 2019-07-08 ENCOUNTER — Ambulatory Visit (INDEPENDENT_AMBULATORY_CARE_PROVIDER_SITE_OTHER): Payer: Medicare Other | Admitting: Family Medicine

## 2019-07-08 VITALS — BP 110/78 | HR 65 | Temp 97.9°F | Ht 62.0 in | Wt 115.6 lb

## 2019-07-08 DIAGNOSIS — E785 Hyperlipidemia, unspecified: Secondary | ICD-10-CM | POA: Diagnosis not present

## 2019-07-08 DIAGNOSIS — M5412 Radiculopathy, cervical region: Secondary | ICD-10-CM

## 2019-07-08 DIAGNOSIS — M816 Localized osteoporosis [Lequesne]: Secondary | ICD-10-CM

## 2019-07-08 DIAGNOSIS — M797 Fibromyalgia: Secondary | ICD-10-CM | POA: Diagnosis not present

## 2019-07-08 LAB — COMPREHENSIVE METABOLIC PANEL
ALT: 16 U/L (ref 0–35)
AST: 17 U/L (ref 0–37)
Albumin: 4.7 g/dL (ref 3.5–5.2)
Alkaline Phosphatase: 38 U/L — ABNORMAL LOW (ref 39–117)
BUN: 20 mg/dL (ref 6–23)
CO2: 33 mEq/L — ABNORMAL HIGH (ref 19–32)
Calcium: 10.3 mg/dL (ref 8.4–10.5)
Chloride: 101 mEq/L (ref 96–112)
Creatinine, Ser: 0.7 mg/dL (ref 0.40–1.20)
GFR: 82.92 mL/min (ref >60.00)
Glucose, Bld: 75 mg/dL (ref 70–99)
Potassium: 4.6 mEq/L (ref 3.5–5.1)
Sodium: 140 mEq/L (ref 135–145)
Total Bilirubin: 0.5 mg/dL (ref 0.2–1.2)
Total Protein: 6.7 g/dL (ref 6.0–8.3)

## 2019-07-08 LAB — CBC
HCT: 40.6 % (ref 36.0–46.0)
Hemoglobin: 13.5 g/dL (ref 12.0–15.0)
MCHC: 33.2 g/dL (ref 30.0–36.0)
MCV: 91.6 fl (ref 78.0–100.0)
Platelets: 202 10*3/uL (ref 150.0–400.0)
RBC: 4.43 Mil/uL (ref 3.87–5.11)
RDW: 13.3 % (ref 11.5–15.5)
WBC: 7.8 10*3/uL (ref 4.0–10.5)

## 2019-07-08 LAB — TSH: TSH: 3.41 u[IU]/mL (ref 0.35–4.50)

## 2019-07-08 LAB — LIPID PANEL
Cholesterol: 243 mg/dL — ABNORMAL HIGH (ref 0–200)
HDL: 79.7 mg/dL (ref >39.00)
LDL Cholesterol: 142 mg/dL — ABNORMAL HIGH (ref 0–99)
NonHDL: 163.5
Total CHOL/HDL Ratio: 3
Triglycerides: 108 mg/dL (ref 0.0–149.0)
VLDL: 21.6 mg/dL (ref 0.0–40.0)

## 2019-07-08 MED ORDER — CELECOXIB 200 MG PO CAPS: ORAL_CAPSULE | ORAL | 1 refills | Status: DC

## 2019-07-08 NOTE — Patient Instructions (Signed)
It was very nice to see you today!  I will refill your Celebrex today.  We will check blood work today.  Our records indicate that you are due for your next Prolia injection in September.  Please let us know if this is not accurate.  I will see you back in December.  Take care, Dr Jerline Pain  Please try these tips to maintain a healthy lifestyle:   Eat at least 3 REAL meals and 1-2 snacks per day.  Aim for no more than 5 hours between eating.  If you eat breakfast, please do so within one hour of getting up.    Each meal should contain half fruits/vegetables, one quarter protein, and one quarter carbs (no bigger than a computer mouse)   Cut down on sweet beverages. This includes juice, soda, and sweet tea.     Drink at least 1 glass of water with each meal and aim for at least 8 glasses per day   Exercise at least 150 minutes every week.

## 2019-07-08 NOTE — Assessment & Plan Note (Signed)
Our records indicate most recent Prolia injection on 04/07/2019.  She is due on 10/08/2019.  She will check with her records at home to see if this is accurate.

## 2019-07-08 NOTE — Progress Notes (Signed)
Deanna [name] is a 69 y.o. female who presents today for an office visit.  Assessment/Plan:  Chronic Problems Addressed Today: Fibromyalgia, Rx Soma, stable for years Overall stable.  She has having very intermittent random sharp shooting chest pain lasts a few minutes and then subsides.  No red flags.  We will continue Soma and Celebrex.  Very low suspicion for cardiac etiology given atypical history.  Discussed warning signs and reasons to return to care.  She will follow me in 6 months.  Osteoporosis, Rx Prolia Our records indicate most recent Prolia injection on 04/07/2019.  She is due on 10/08/2019.  She will check with her records at home to see if this is accurate.  Dyslipidemia Check CBC, C met, TSH, lipid panel.     Subjective:  HPI:  Her stable, chronic medical conditions are outlined below:  # Fibromyalgia / Osteoarthritis  - On soma 362m daily and tolerating well.  - On celebrex 2061mdaily as needed - ROS: No excessive sedation or drowsiness.   # Osteoporosis - On prolia 6078mvery 6 months  - On calcium and vitamin D supplementation        Objective:  Physical Exam: BP 110/78   Pulse 65   Temp 97.9 F (36.6 C) (Temporal)   Ht 5' 2" (1.575 m)   Wt 115 lb 9.6 oz (52.4 kg)   SpO2 95%   BMI 21.14 kg/m   Gen: No acute distress, resting comfortably CV: Regular rate and rhythm with no murmurs appreciated Pulm: Normal work of breathing, clear to auscultation bilaterally with no crackles, wheezes, or rhonchi Neuro: Grossly normal, moves all extremities Psych: Normal affect and thought content      Drayden Lukas M. ParJerline PainD 07/08/2019 10:33 AM

## 2019-07-08 NOTE — Assessment & Plan Note (Signed)
Overall stable.  She has having very intermittent random sharp shooting chest pain lasts a few minutes and then subsides.  No red flags.  We will continue Soma and Celebrex.  Very low suspicion for cardiac etiology given atypical history.  Discussed warning signs and reasons to return to care.  She will follow me in 6 months.

## 2019-07-08 NOTE — Assessment & Plan Note (Signed)
Check CBC, C met, TSH, lipid panel. 

## 2019-07-08 NOTE — Progress Notes (Signed)
Please inform patient of the following:  Cholesterol up slightly but everything else is normal.  We  do not need to start medications.  Recommended she continue work on diet and exercise and we can recheck in a year or so.

## 2019-08-21 DIAGNOSIS — Z20822 Contact with and (suspected) exposure to covid-19: Secondary | ICD-10-CM | POA: Diagnosis not present

## 2019-09-03 ENCOUNTER — Other Ambulatory Visit: Payer: Self-pay | Admitting: Family Medicine

## 2019-09-03 ENCOUNTER — Other Ambulatory Visit: Payer: Self-pay | Admitting: *Deleted

## 2019-09-03 DIAGNOSIS — M5412 Radiculopathy, cervical region: Secondary | ICD-10-CM

## 2019-09-03 DIAGNOSIS — M797 Fibromyalgia: Secondary | ICD-10-CM

## 2019-09-03 MED ORDER — CARISOPRODOL 350 MG PO TABS: 350.0000 mg | ORAL_TABLET | Freq: Every evening | ORAL | 0 refills | Status: DC | PRN

## 2019-09-03 NOTE — Telephone Encounter (Signed)
Pt requesting refill on Soma. Last OV 07/2019.

## 2019-09-11 DIAGNOSIS — D2272 Melanocytic nevi of left lower limb, including hip: Secondary | ICD-10-CM | POA: Diagnosis not present

## 2019-09-11 DIAGNOSIS — L308 Other specified dermatitis: Secondary | ICD-10-CM | POA: Diagnosis not present

## 2019-09-11 DIAGNOSIS — D225 Melanocytic nevi of trunk: Secondary | ICD-10-CM | POA: Diagnosis not present

## 2019-09-11 DIAGNOSIS — L821 Other seborrheic keratosis: Secondary | ICD-10-CM | POA: Diagnosis not present

## 2019-09-11 DIAGNOSIS — Z85828 Personal history of other malignant neoplasm of skin: Secondary | ICD-10-CM | POA: Diagnosis not present

## 2019-09-11 DIAGNOSIS — L814 Other melanin hyperpigmentation: Secondary | ICD-10-CM | POA: Diagnosis not present

## 2019-09-11 DIAGNOSIS — D2271 Melanocytic nevi of right lower limb, including hip: Secondary | ICD-10-CM | POA: Diagnosis not present

## 2019-09-11 DIAGNOSIS — D1801 Hemangioma of skin and subcutaneous tissue: Secondary | ICD-10-CM | POA: Diagnosis not present

## 2019-10-08 ENCOUNTER — Ambulatory Visit (INDEPENDENT_AMBULATORY_CARE_PROVIDER_SITE_OTHER): Payer: Medicare Other

## 2019-10-08 ENCOUNTER — Other Ambulatory Visit: Payer: Self-pay

## 2019-10-08 DIAGNOSIS — M810 Age-related osteoporosis without current pathological fracture: Secondary | ICD-10-CM

## 2019-10-08 MED ORDER — DENOSUMAB 60 MG/ML ~~LOC~~ SOSY
60.0000 mg | PREFILLED_SYRINGE | Freq: Once | SUBCUTANEOUS | Status: AC
  Administered 2019-10-08: 60 mg via SUBCUTANEOUS

## 2019-10-08 NOTE — Progress Notes (Signed)
Deanna [name] 69 y.o. female presents to office today for 6 month Prolia injection per Dimas Chyle, MD. Administered DENOSUMAB 60 mg/mL subcutaneous left arm. Patient tolerated well. [place]. This will be her last prolia injection with our office.

## 2019-10-08 NOTE — Progress Notes (Signed)
I have reviewed the patient's encounter and agree with the documentation.  Deanna [name]. Jerline Pain, MD 10/08/2019 11:22 AM

## 2019-10-14 DIAGNOSIS — Z1231 Encounter for screening mammogram for malignant neoplasm of breast: Secondary | ICD-10-CM | POA: Diagnosis not present

## 2019-10-14 LAB — HM MAMMOGRAPHY

## 2019-11-05 ENCOUNTER — Encounter: Payer: Self-pay | Admitting: Family Medicine

## 2019-11-18 NOTE — Progress Notes (Signed)
69 y.o. [id number] Married White or Caucasian Not Hispanic or Latino female here for annual exam.  No vaginal bleeding. Not sexually active, no bowel or bladder changes.   In the process of moving to [place]. Building a house near her son.     No LMP recorded. Patient is postmenopausal.          Sexually active: No.  The current method of family planning is post menopausal status.    Exercising: Yes.    Yoga, walking  Smoker:  no  Health Maintenance: Pap: 10/2016 WNL negative HPV, 07/24/2012 WNL NEG HPV  History of abnormal Pap:  no MMG:  10/14/19: patient was told normal.  10/09/18 density B Bi-rads 1 neg  BMD:   12/06/18 osteoporosis (manged by primary) Colonoscopy: 06/24/2018, polyps, f/u in 7 years (was told she will be over 73, will not do again unless she is having issues).  TDaP:  2013 Gardasil: none    reports that she has never smoked. She has never used smokeless tobacco. She reports current alcohol use of about 2.0 standard drinks of alcohol per week. She reports that she does not use drugs. Retired. 2 kids ([place]), 4 biological grandchildren, 6 step-grandchildren.  Past Medical History:  Diagnosis Date  . Atrophic vaginitis   . Chronic urinary tract infection   . Endometrial polyp   . Fibromyalgia   . GERD (gastroesophageal reflux disease)   . H/O hiatal hernia   . Osteoporosis   . PONV (postoperative nausea and vomiting)   . Spinal stenosis     Past Surgical History:  Procedure Laterality Date  . BARTHOLIN CYST MARSUPIALIZATION    . BUNIONECTOMY    . DILATION AND CURETTAGE OF UTERUS    . EAR LOBE SURGERY    . FACIAL COSMETIC SURGERY    . KNEE SURGERY Left   . LUMBAR FUSION    . TUBAL LIGATION      Current Outpatient Medications  Medication Sig Dispense Refill  . acetaminophen (TYLENOL) 500 MG tablet Take 500 mg by mouth every 8 (eight) hours as needed.    . calcium carbonate (OS-CAL) 600 MG TABS Take 600 mg by mouth daily with breakfast.    . carisoprodol  (SOMA) 350 MG tablet Take 1 tablet (350 mg total) by mouth at bedtime as needed for muscle spasms. 90 tablet 0  . celecoxib (CELEBREX) 200 MG capsule TAKE 1 CAPSULE BY MOUTH EVERY DAY 90 capsule 1  . cholecalciferol (VITAMIN D) 1000 UNITS tablet Take 1,000 Units by mouth daily.    . cyanocobalamin 1000 MCG tablet Take 1,000 mcg by mouth daily.    Marland Kitchen denosumab (PROLIA) 60 MG/ML SOSY injection Inject 60 mg into the skin every 6 (six) months.    . Magnesium 250 MG TABS Take 250 mg by mouth daily.     No current facility-administered medications for this visit.    Family History  Problem Relation Age of Onset  . CAD Mother 40  . Dementia Mother   . Hypertension Father   . Heart disease Father        Pacemaker  . Other Father        Myasthenia Gravis  . Melanoma Father   . Dementia Father   . CAD Maternal Grandfather 22       Died of MI    Review of Systems  All other systems reviewed and are negative.   Exam:   BP 130/76   Pulse 97   Ht  5\' 2"  (1.575 m)   Wt 113 lb (51.3 kg)   SpO2 99%   BMI 20.67 kg/m   Weight change: @WEIGHTCHANGE @ Height:   Height: 5\' 2"  (157.5 cm)  Ht Readings from Last 3 Encounters:  11/19/19 5\' 2"  (1.575 m)  07/08/19 5\' 2"  (1.575 m)  04/07/19 5\' 2"  (1.575 m)    General appearance: alert, cooperative and appears stated age Head: Normocephalic, without obvious abnormality, atraumatic Breasts: normal appearance, no masses or tenderness Abdomen: soft, non-tender; non distended,  no masses,  no organomegaly Extremities: extremities normal, atraumatic, no cyanosis or edema Skin: Skin color, texture, turgor normal. No rashes or lesions Lymph nodes: Cervical, supraclavicular, and axillary nodes normal. No abnormal inguinal nodes palpated Neurologic: Grossly normal   Pelvic: External genitalia:  no lesions              Urethra:  normal appearing urethra with no masses, tenderness or lesions              Bartholins and Skenes: normal                  Vagina: normal appearing vagina with normal color and discharge, no lesions              Cervix: no lesions               Bimanual Exam:  Uterus:  normal size, contour, position, consistency, mobility, non-tender              Adnexa: no mass, fullness, tenderness               Rectovaginal: Confirms               Anus:  normal sphincter tone, no lesions  Gae Dry chaperoned for the exam.  A:  Well Woman with normal exam  Osteoporosis, on prolia.   P:   No pap this year  Discussed breast self exam  Discussed calcium and vit D intake  Mammogram, colonoscopy, DEXA UTD

## 2019-11-19 ENCOUNTER — Other Ambulatory Visit: Payer: Self-pay

## 2019-11-19 ENCOUNTER — Encounter: Payer: Self-pay | Admitting: Obstetrics and Gynecology

## 2019-11-19 ENCOUNTER — Ambulatory Visit (INDEPENDENT_AMBULATORY_CARE_PROVIDER_SITE_OTHER): Payer: Medicare Other | Admitting: Obstetrics and Gynecology

## 2019-11-19 VITALS — BP 130/76 | HR 97 | Ht 62.0 in | Wt 113.0 lb

## 2019-11-19 DIAGNOSIS — Z01419 Encounter for gynecological examination (general) (routine) without abnormal findings: Secondary | ICD-10-CM

## 2019-11-19 DIAGNOSIS — Z8739 Personal history of other diseases of the musculoskeletal system and connective tissue: Secondary | ICD-10-CM

## 2019-11-19 NOTE — Patient Instructions (Signed)
EXERCISE AND DIET:  We recommended that you start or continue a regular exercise program for good health. Regular exercise means any activity that makes your heart beat faster and makes you sweat.  We recommend exercising at least 30 minutes per day at least 3 days a week, preferably 4 or 5.  We also recommend a diet low in fat and sugar.  Inactivity, poor dietary choices and obesity can cause diabetes, heart attack, stroke, and kidney damage, among others.    ALCOHOL AND SMOKING:  Women should limit their alcohol intake to no more than 7 drinks/beers/glasses of wine (combined, not each!) per week. Moderation of alcohol intake to this level decreases your risk of breast cancer and liver damage. And of course, no recreational drugs are part of a healthy lifestyle.  And absolutely no smoking or even second hand smoke. Most people know smoking can cause heart and lung diseases, but did you know it also contributes to weakening of your bones? Aging of your skin?  Yellowing of your teeth and nails?  CALCIUM AND VITAMIN D:  Adequate intake of calcium and Vitamin D are recommended.  The recommendations for exact amounts of these supplements seem to change often, but generally speaking 1,200 mg of calcium (between diet and supplement) and 800 units of Vitamin D per day seems prudent. Certain women may benefit from higher intake of Vitamin D.  If you are among these women, your doctor will have told you during your visit.    PAP SMEARS:  Pap smears, to check for cervical cancer or precancers,  have traditionally been done yearly, although recent scientific advances have shown that most women can have pap smears less often.  However, every woman still should have a physical exam from her gynecologist every year. It will include a breast check, inspection of the vulva and vagina to check for abnormal growths or skin changes, a visual exam of the cervix, and then an exam to evaluate the size and shape of the uterus and  ovaries.  And after 69 years of age, a rectal exam is indicated to check for rectal cancers. We will also provide age appropriate advice regarding health maintenance, like when you should have certain vaccines, screening for sexually transmitted diseases, bone density testing, colonoscopy, mammograms, etc.   MAMMOGRAMS:  All women over 40 years old should have a yearly mammogram. Many facilities now offer a "3D" mammogram, which may cost around $50 extra out of pocket. If possible,  we recommend you accept the option to have the 3D mammogram performed.  It both reduces the number of women who will be called back for extra views which then turn out to be normal, and it is better than the routine mammogram at detecting truly abnormal areas.    COLON CANCER SCREENING: Now recommend starting at age 45. At this time colonoscopy is not covered for routine screening until 50. There are take home tests that can be done between 45-49.   COLONOSCOPY:  Colonoscopy to screen for colon cancer is recommended for all women at age 50.  We know, you hate the idea of the prep.  We agree, BUT, having colon cancer and not knowing it is worse!!  Colon cancer so often starts as a polyp that can be seen and removed at colonscopy, which can quite literally save your life!  And if your first colonoscopy is normal and you have no family history of colon cancer, most women don't have to have it again for   10 years.  Once every ten years, you can do something that may end up saving your life, right?  We will be happy to help you get it scheduled when you are ready.  Be sure to check your insurance coverage so you understand how much it will cost.  It may be covered as a preventative service at no cost, but you should check your particular policy.      Breast Self-Awareness Breast self-awareness means being familiar with how your breasts look and feel. It involves checking your breasts regularly and reporting any changes to your  health care provider. Practicing breast self-awareness is important. A change in your breasts can be a sign of a serious medical problem. Being familiar with how your breasts look and feel allows you to find any problems early, when treatment is more likely to be successful. All women should practice breast self-awareness, including women who have had breast implants. How to do a breast self-exam One way to learn what is normal for your breasts and whether your breasts are changing is to do a breast self-exam. To do a breast self-exam: Look for Changes  1. Remove all the clothing above your waist. 2. Stand in front of a mirror in a room with good lighting. 3. Put your hands on your hips. 4. Push your hands firmly downward. 5. Compare your breasts in the mirror. Look for differences between them (asymmetry), such as: ? Differences in shape. ? Differences in size. ? Puckers, dips, and bumps in one breast and not the other. 6. Look at each breast for changes in your skin, such as: ? Redness. ? Scaly areas. 7. Look for changes in your nipples, such as: ? Discharge. ? Bleeding. ? Dimpling. ? Redness. ? A change in position. Feel for Changes Carefully feel your breasts for lumps and changes. It is best to do this while lying on your back on the floor and again while sitting or standing in the shower or tub with soapy water on your skin. Feel each breast in the following way:  Place the arm on the side of the breast you are examining above your head.  Feel your breast with the other hand.  Start in the nipple area and make  inch (2 cm) overlapping circles to feel your breast. Use the pads of your three middle fingers to do this. Apply light pressure, then medium pressure, then firm pressure. The light pressure will allow you to feel the tissue closest to the skin. The medium pressure will allow you to feel the tissue that is a little deeper. The firm pressure will allow you to feel the tissue  close to the ribs.  Continue the overlapping circles, moving downward over the breast until you feel your ribs below your breast.  Move one finger-width toward the center of the body. Continue to use the  inch (2 cm) overlapping circles to feel your breast as you move slowly up toward your collarbone.  Continue the up and down exam using all three pressures until you reach your armpit.  Write Down What You Find  Write down what is normal for each breast and any changes that you find. Keep a written record with breast changes or normal findings for each breast. By writing this information down, you do not need to depend only on memory for size, tenderness, or location. Write down where you are in your menstrual cycle, if you are still menstruating. If you are having trouble noticing differences   in your breasts, do not get discouraged. With time you will become more familiar with the variations in your breasts and more comfortable with the exam. How often should I examine my breasts? Examine your breasts every month. If you are breastfeeding, the best time to examine your breasts is after a feeding or after using a breast pump. If you menstruate, the best time to examine your breasts is 5-7 days after your period is over. During your period, your breasts are lumpier, and it may be more difficult to notice changes. When should I see my health care provider? See your health care provider if you notice:  A change in shape or size of your breasts or nipples.  A change in the skin of your breast or nipples, such as a reddened or scaly area.  Unusual discharge from your nipples.  A lump or thick area that was not there before.  Pain in your breasts.  Anything that concerns you.  

## 2019-12-01 ENCOUNTER — Other Ambulatory Visit: Payer: Self-pay | Admitting: Family Medicine

## 2019-12-01 DIAGNOSIS — M5412 Radiculopathy, cervical region: Secondary | ICD-10-CM

## 2019-12-01 DIAGNOSIS — M797 Fibromyalgia: Secondary | ICD-10-CM

## 2019-12-01 NOTE — Telephone Encounter (Signed)
LAST APPOINTMENT DATE:07/08/2019   NEXT APPOINTMENT DATE: Visit date not found  Rx Soma  LAST REFILL: 09/03/2019  QTY: 90 REf: 0

## 2020-01-08 ENCOUNTER — Telehealth: Payer: Self-pay

## 2020-01-08 NOTE — Telephone Encounter (Signed)
Open in error

## 2020-01-09 DIAGNOSIS — M25562 Pain in left knee: Secondary | ICD-10-CM | POA: Diagnosis not present

## 2020-01-09 DIAGNOSIS — M79662 Pain in left lower leg: Secondary | ICD-10-CM | POA: Diagnosis not present

## 2020-02-06 ENCOUNTER — Other Ambulatory Visit: Payer: Self-pay | Admitting: Family Medicine

## 2020-02-06 DIAGNOSIS — M797 Fibromyalgia: Secondary | ICD-10-CM

## 2020-02-06 DIAGNOSIS — M5412 Radiculopathy, cervical region: Secondary | ICD-10-CM
